# Patient Record
Sex: Female | Born: 1970
Health system: Southern US, Community
[De-identification: ages and names within clinical notes are randomized; demographics above are authoritative.]

## PROBLEM LIST (undated history)

## (undated) DIAGNOSIS — E669 Obesity, unspecified: Secondary | ICD-10-CM

## (undated) DIAGNOSIS — K219 Gastro-esophageal reflux disease without esophagitis: Secondary | ICD-10-CM

## (undated) DIAGNOSIS — E785 Hyperlipidemia, unspecified: Secondary | ICD-10-CM

## (undated) DIAGNOSIS — D509 Iron deficiency anemia, unspecified: Secondary | ICD-10-CM

## (undated) HISTORY — DX: Obesity, unspecified: E66.9

## (undated) HISTORY — DX: Gastro-esophageal reflux disease without esophagitis: K21.9

## (undated) HISTORY — PX: TUBAL LIGATION: SHX77

## (undated) HISTORY — DX: Hyperlipidemia, unspecified: E78.5

## (undated) HISTORY — DX: Iron deficiency anemia, unspecified: D50.9

## (undated) HISTORY — PX: CHOLECYSTECTOMY: SHX55

---

## 1998-06-02 ENCOUNTER — Ambulatory Visit (HOSPITAL_COMMUNITY): Admission: RE | Admit: 1998-06-02 | Discharge: 1998-06-02 | Payer: Self-pay | Admitting: Obstetrics

## 1998-09-08 ENCOUNTER — Ambulatory Visit (HOSPITAL_COMMUNITY): Admission: RE | Admit: 1998-09-08 | Discharge: 1998-09-08 | Payer: Self-pay | Admitting: *Deleted

## 1998-10-10 ENCOUNTER — Inpatient Hospital Stay (HOSPITAL_COMMUNITY): Admission: AD | Admit: 1998-10-10 | Discharge: 1998-10-12 | Payer: Self-pay | Admitting: *Deleted

## 2002-11-30 ENCOUNTER — Ambulatory Visit (HOSPITAL_COMMUNITY): Admission: RE | Admit: 2002-11-30 | Discharge: 2002-11-30 | Payer: Self-pay | Admitting: Nephrology

## 2002-11-30 ENCOUNTER — Encounter: Payer: Self-pay | Admitting: Nephrology

## 2002-12-03 ENCOUNTER — Ambulatory Visit (HOSPITAL_COMMUNITY): Admission: RE | Admit: 2002-12-03 | Discharge: 2002-12-03 | Payer: Self-pay | Admitting: Nephrology

## 2002-12-03 ENCOUNTER — Encounter: Payer: Self-pay | Admitting: Nephrology

## 2002-12-06 ENCOUNTER — Other Ambulatory Visit: Admission: RE | Admit: 2002-12-06 | Discharge: 2002-12-06 | Payer: Self-pay | Admitting: Nephrology

## 2003-04-02 ENCOUNTER — Ambulatory Visit (HOSPITAL_COMMUNITY): Admission: RE | Admit: 2003-04-02 | Discharge: 2003-04-02 | Payer: Self-pay | Admitting: Nephrology

## 2003-04-02 ENCOUNTER — Encounter: Payer: Self-pay | Admitting: Nephrology

## 2003-04-29 ENCOUNTER — Encounter (HOSPITAL_BASED_OUTPATIENT_CLINIC_OR_DEPARTMENT_OTHER): Payer: Self-pay | Admitting: General Surgery

## 2003-05-02 ENCOUNTER — Encounter (HOSPITAL_BASED_OUTPATIENT_CLINIC_OR_DEPARTMENT_OTHER): Payer: Self-pay | Admitting: General Surgery

## 2003-05-02 ENCOUNTER — Encounter (INDEPENDENT_AMBULATORY_CARE_PROVIDER_SITE_OTHER): Payer: Self-pay | Admitting: *Deleted

## 2003-05-02 ENCOUNTER — Ambulatory Visit (HOSPITAL_COMMUNITY): Admission: RE | Admit: 2003-05-02 | Discharge: 2003-05-03 | Payer: Self-pay | Admitting: General Surgery

## 2008-06-07 ENCOUNTER — Ambulatory Visit (HOSPITAL_COMMUNITY): Admission: RE | Admit: 2008-06-07 | Discharge: 2008-06-07 | Payer: Self-pay | Admitting: Gastroenterology

## 2010-12-04 NOTE — Op Note (Signed)
[name], [name] NO.:  [id number]   MEDICAL RECORD NO.:  [id number]                   PATIENT TYPE:  OIB   LOCATION:  5733                                 FACILITY:  [facility]   PHYSICIAN:  Leonie Man, M.D.                DATE OF BIRTH:  11-09-1970   DATE OF PROCEDURE:  05/02/2003  DATE OF DISCHARGE:                                 OPERATIVE REPORT   PREOPERATIVE DIAGNOSES:  Chronic calculus cholecystitis.   POSTOPERATIVE DIAGNOSES:  Chronic calculus cholecystitis.   PROCEDURE:  1. Laparoscopic cholecystectomy.  2. Intraoperative cholangiogram.   SURGEON:  Leonie Man, M.D.   ASSISTANT:  Joanne Gavel, M.D.   ANESTHESIA:  General.   INDICATIONS:  This patient is a 40 year old woman presenting with chronic  recurrent upper abdominal pain.  This is associated with nausea and  vomiting.  She underwent abdominal ultrasound which showed multiple  gallstones.  She had a biliary scan which showed cystic duct obstruction.  She comes to the operating room now after the risks and potential benefits  of surgery had been fully discussed, all questions answered and consent  obtained.   DESCRIPTION OF PROCEDURE:  Following the induction of satisfactory general  anesthesia, the patient was positioned supine.  The abdomen was routinely  prepped and draped to be included in the sterile operative field.  Open  laparoscopy created at the umbilicus with insertion of a Hasson cannula and  insufflation of the peritoneal cavity to 14 mmHg.  The scope was inserted  and visual exploration of the abdomen carried out.   The edges were sharp.  There were multiple adhesions from the surface of the  liver to the diaphragm.  The gallbladder was chronically scarred.  The  anterior gastric wall and duodenum sweep appeared to be normal.  The small  and large intestine viewed and appeared to be abnormal.  Pelvic organs were  not visualized.   Under direct vision,  epigastric and lateral ports were placed.  The  gallbladder was grasped and retracted cephalad, and dissection carried down  to the region of the ampulla and the hepatoduodenal ligament, where the  cystic artery and cystic duct were isolated.  The cystic duct was traced to  its gallbladder/cystic duct junction.  The cystic artery was traced to its  entry into the gallbladder wall.  The cystic artery was doubly clipped and  transected.  The cystic duct was clipped proximally and opened.  The cystic  duct cholangiogram was carried out by inserting a Riddick catheter into the  abdomen, through a 14 gauge angiocatheter; injecting one-half strength  Hypaque into the biliary system while under fluoroscopic control.   The resulting fluoroscopic cholangiogram showed free flow of contrast into  the duodenum, normal biliary calibers and no filling defects.  The  angiocatheter was then removed and the cystic duct was doubly clipped and  then  transected.  The gallbladder was dissected free from the liver bed  using electrocautery and maintaining hemostasis throughout the course of the  dissection.   At the end of the dissection, the liver bed was checked for hemostasis;  noted to be dry.  The gallbladder was placed in an endocatch and retrieved  through the umbilicus.  Sponge, instruments and sharp counts were verified.  Trocars were removed under direct vision.   The abdominal wound was closed in layers as follows, after the  pneumoperitoneum had been completely evacuated.  We closed the wound in two  layers with #0 Vicryl and 4-0 Monocryl.  Epigastric and lateral ports closed  with 4-0 Monocryl sutures and then reinforced with Steri-Strips.  Sterile  dressing was applied.   The anesthetic reversed and patient removed from the operating room to the  recovery room in stable condition.  She tolerated the procedure well.                                               Leonie Man, M.D.     PB/MEDQ  D:  05/02/2003  T:  05/02/2003  Job:  [id number]

## 2012-09-27 ENCOUNTER — Ambulatory Visit (INDEPENDENT_AMBULATORY_CARE_PROVIDER_SITE_OTHER): Payer: 59 | Admitting: Emergency Medicine

## 2012-09-27 VITALS — BP 142/80 | HR 105 | Temp 98.5°F | Resp 18 | Ht 60.5 in | Wt 233.6 lb

## 2012-09-27 DIAGNOSIS — M5430 Sciatica, unspecified side: Secondary | ICD-10-CM | POA: Insufficient documentation

## 2012-09-27 DIAGNOSIS — M5431 Sciatica, right side: Secondary | ICD-10-CM

## 2012-09-27 DIAGNOSIS — E669 Obesity, unspecified: Secondary | ICD-10-CM | POA: Insufficient documentation

## 2012-09-27 MED ORDER — TRAMADOL HCL 50 MG PO TABS: 50.0000 mg | ORAL_TABLET | Freq: Four times a day (QID) | ORAL | Status: DC | PRN

## 2012-09-27 NOTE — Progress Notes (Signed)
[facility] [address] [place] [address] [phone]  Date:  09/27/2012   Name:  [name]   DOB:  08-May-1971   MRN:  [id number]  PCP:  No primary provider on file.    Chief Complaint: Hip Pain   History of Present Illness:  [name] is a 42 y.o. very pleasant female patient who presents with the following:  1 year long history of pain radiating into right leg from buttock.  Says is associated with numbness and weakness of the leg at times.  Describes the pain as a nagging burning pain.  Is intermittent and worse when she sits.  No history of injury.  No history of overuse.  No improvement with over the counter medications or other home remedies. Never had diagnosis of back pain or injury  There is no problem list on file for this patient.   History reviewed. No pertinent past medical history.  Past Surgical History  Procedure Laterality Date  . Cholecystectomy    . Tubal ligation      History  Substance Use Topics  . Smoking status: Never Smoker   . Smokeless tobacco: Not on file  . Alcohol Use: No    Family History  Problem Relation Age of Onset  . Cancer Mother   . Multiple sclerosis Father   . Sarcoidosis Sister     No Known Allergies  Medication list has been reviewed and updated.  No current outpatient prescriptions on file prior to visit.   No current facility-administered medications on file prior to visit.    Review of Systems:  As per HPI, otherwise negative.    Physical Examination: Filed Vitals:   09/27/12 1719  BP: 142/80  Pulse: 105  Temp: 98.5 F (36.9 C)  Resp: 18   Filed Vitals:   09/27/12 1719  Height: 5' 0.5" (1.537 m)  Weight: 233 lb 9.6 oz (105.96 kg)   Body mass index is 44.85 kg/(m^2). Ideal Body Weight: Weight in (lb) to have BMI = 25: 129.9  GEN: morbidly obese, NAD, Non-toxic, A & O x 3 HEENT: Atraumatic, Normocephalic. Neck supple. No masses, No LAD. Ears and Nose: No external  deformity. CV: RRR, No M/G/R. No JVD. No thrill. No extra heart sounds. PULM: CTA B, no wheezes, crackles, rhonchi. No retractions. No resp. distress. No accessory muscle use. ABD: S, NT, ND, +BS. No rebound. No HSM. EXTR: No c/c/e NEURO Normal gait.  PSYCH: Normally interactive. Conversant. Not depressed or anxious appearing.  Calm demeanor.  Back:  Tender right sciatic notch.  Motor and sensory intact  Assessment and Plan: Sciatic neuritis MRI tramadol  Carmelina Dane, MD

## 2012-09-27 NOTE — Patient Instructions (Addendum)
Sciatica Sciatica is pain, weakness, numbness, or tingling along the path of the sciatic nerve. The nerve starts in the lower back and runs down the back of each leg. The nerve controls the muscles in the lower leg and in the back of the knee, while also providing sensation to the back of the thigh, lower leg, and the sole of your foot. Sciatica is a symptom of another medical condition. For instance, nerve damage or certain conditions, such as a herniated disk or bone spur on the spine, pinch or put pressure on the sciatic nerve. This causes the pain, weakness, or other sensations normally associated with sciatica. Generally, sciatica only affects one side of the body. CAUSES   Herniated or slipped disc.  Degenerative disk disease.  A pain disorder involving the narrow muscle in the buttocks (piriformis syndrome).  Pelvic injury or fracture.  Pregnancy.  Tumor (rare). SYMPTOMS  Symptoms can vary from mild to very severe. The symptoms usually travel from the low back to the buttocks and down the back of the leg. Symptoms can include:  Mild tingling or dull aches in the lower back, leg, or hip.  Numbness in the back of the calf or sole of the foot.  Burning sensations in the lower back, leg, or hip.  Sharp pains in the lower back, leg, or hip.  Leg weakness.  Severe back pain inhibiting movement. These symptoms may get worse with coughing, sneezing, laughing, or prolonged sitting or standing. Also, being overweight may worsen symptoms. DIAGNOSIS  Your caregiver will perform a physical exam to look for common symptoms of sciatica. He or she may ask you to do certain movements or activities that would trigger sciatic nerve pain. Other tests may be performed to find the cause of the sciatica. These may include:  Blood tests.  X-rays.  Imaging tests, such as an MRI or CT scan. TREATMENT  Treatment is directed at the cause of the sciatic pain. Sometimes, treatment is not necessary  and the pain and discomfort goes away on its own. If treatment is needed, your caregiver may suggest:  Over-the-counter medicines to relieve pain.  Prescription medicines, such as anti-inflammatory medicine, muscle relaxants, or narcotics.  Applying heat or ice to the painful area.  Steroid injections to lessen pain, irritation, and inflammation around the nerve.  Reducing activity during periods of pain.  Exercising and stretching to strengthen your abdomen and improve flexibility of your spine. Your caregiver may suggest losing weight if the extra weight makes the back pain worse.  Physical therapy.  Surgery to eliminate what is pressing or pinching the nerve, such as a bone spur or part of a herniated disk. HOME CARE INSTRUCTIONS   Only take over-the-counter or prescription medicines for pain or discomfort as directed by your caregiver.  Apply ice to the affected area for 20 minutes, 3 4 times a day for the first 48 72 hours. Then try heat in the same way.  Exercise, stretch, or perform your usual activities if these do not aggravate your pain.  Attend physical therapy sessions as directed by your caregiver.  Keep all follow-up appointments as directed by your caregiver.  Do not wear high heels or shoes that do not provide proper support.  Check your mattress to see if it is too soft. A firm mattress may lessen your pain and discomfort. SEEK IMMEDIATE MEDICAL CARE IF:   You lose control of your bowel or bladder (incontinence).  You have increasing weakness in the lower back,   pelvis, buttocks, or legs.  You have redness or swelling of your back.  You have a burning sensation when you urinate.  You have pain that gets worse when you lie down or awakens you at night.  Your pain is worse than you have experienced in the past.  Your pain is lasting longer than 4 weeks.  You are suddenly losing weight without reason. MAKE SURE YOU:  Understand these  instructions.  Will watch your condition.  Will get help right away if you are not doing well or get worse. Document Released: 06/29/2001 Document Revised: 01/04/2012 Document Reviewed: 11/14/2011 [employer] Patient Information 2013 [employer].  

## 2012-09-28 NOTE — Progress Notes (Signed)
Reviewed and agree.

## 2012-09-29 ENCOUNTER — Ambulatory Visit (HOSPITAL_COMMUNITY): Admission: RE | Admit: 2012-09-29 | Payer: 59 | Source: Ambulatory Visit

## 2012-10-02 ENCOUNTER — Ambulatory Visit (HOSPITAL_COMMUNITY): Payer: 59

## 2013-01-29 ENCOUNTER — Other Ambulatory Visit: Payer: Self-pay

## 2013-01-29 DIAGNOSIS — Z1231 Encounter for screening mammogram for malignant neoplasm of breast: Secondary | ICD-10-CM

## 2013-02-16 ENCOUNTER — Ambulatory Visit: Payer: 59

## 2013-02-19 ENCOUNTER — Ambulatory Visit
Admission: RE | Admit: 2013-02-19 | Discharge: 2013-02-19 | Disposition: A | Discharge status: Home / Self Care | Payer: 59 | Source: Ambulatory Visit

## 2013-02-19 DIAGNOSIS — Z1231 Encounter for screening mammogram for malignant neoplasm of breast: Secondary | ICD-10-CM

## 2015-01-30 ENCOUNTER — Ambulatory Visit (INDEPENDENT_AMBULATORY_CARE_PROVIDER_SITE_OTHER): Payer: 59 | Admitting: Family Medicine

## 2015-01-30 VITALS — BP 120/82 | HR 88 | Temp 98.4°F | Resp 14 | Ht 60.0 in | Wt 244.0 lb

## 2015-01-30 DIAGNOSIS — R062 Wheezing: Secondary | ICD-10-CM

## 2015-01-30 DIAGNOSIS — J209 Acute bronchitis, unspecified: Secondary | ICD-10-CM

## 2015-01-30 MED ORDER — CEFDINIR 300 MG PO CAPS: 300.0000 mg | ORAL_CAPSULE | Freq: Two times a day (BID) | ORAL | Status: DC

## 2015-01-30 MED ORDER — ALBUTEROL SULFATE HFA 108 (90 BASE) MCG/ACT IN AERS: 2.0000 | INHALATION_SPRAY | Freq: Four times a day (QID) | RESPIRATORY_TRACT | Status: DC | PRN

## 2015-01-30 NOTE — Patient Instructions (Signed)
Please let me know if you are not improved in the next couple of days, but you will probably cough for a few weeks Use the omnicef twice a day, and the albuterol as needed for wheezing Continue to use mucinex and other OTC medications as needed

## 2015-01-30 NOTE — Progress Notes (Signed)
[facility] [address], [place] [id number] [phone]  Date:  01/30/2015   Name:  [name]   DOB:  [birth year]   MRN:  [id number]  PCP:  No primary care provider on file.    Chief Complaint: Cough and Wheezing   History of Present Illness:  [name] is a 44 y.o. very pleasant female patient who presents with the following:  She has ntoed a cough for about 3 weeks. She is bringing up some green phlegm.  The cough started out dry and then became productive She has noted some wheezing she thinks She has not noted a fever No nasal sx, no ST, no earache No GI symptoms.  She is s/p BTL She has tried some mucinex DM, and some OTC cough and cold med  Patient Active Problem List   Diagnosis Date Noted  . Sciatic neuritis 09/27/2012  . Morbid obesity 09/27/2012    History reviewed. No pertinent past medical history.  Past Surgical History  Procedure Laterality Date  . Cholecystectomy    . Tubal ligation      History  Substance Use Topics  . Smoking status: Never Smoker   . Smokeless tobacco: Not on file  . Alcohol Use: No    Family History  Problem Relation Age of Onset  . Cancer Mother   . Multiple sclerosis Father   . Sarcoidosis Sister     No Known Allergies  Medication list has been reviewed and updated.  No current outpatient prescriptions on file prior to visit.   No current facility-administered medications on file prior to visit.    Review of Systems:  As per HPI- otherwise negative.   Physical Examination: Filed Vitals:   01/30/15 1542  BP: 120/82  Pulse: 88  Temp: 98.4 F (36.9 C)  Resp: 14   Filed Vitals:   01/30/15 1542  Height: 5' (1.524 m)  Weight: 244 lb (110.678 kg)   Body mass index is 47.65 kg/(m^2). Ideal Body Weight: Weight in (lb) to have BMI = 25: 127.7  GEN: WDWN, NAD, Non-toxic, A & O x 3, morbid obesity but OW looks well HEENT: Atraumatic, Normocephalic. Neck supple. No masses, No  LAD.  Bilateral TM wnl, oropharynx normal.  PEERL,EOMI.   Ears and Nose: No external deformity. CV: RRR, No M/G/R. No JVD. No thrill. No extra heart sounds. PULM: CTA B, no wheezes, crackles, rhonchi in bilateral bases/ congestion. No retractions. No resp. distress. No accessory muscle use. EXTR: No c/c/e NEURO Normal gait.  PSYCH: Normally interactive. Conversant. Not depressed or anxious appearing.  Calm demeanor.    Assessment and Plan: Acute bronchitis, unspecified organism - Plan: cefdinir (OMNICEF) 300 MG capsule  Wheezing - Plan: albuterol (PROVENTIL HFA;VENTOLIN HFA) 108 (90 BASE) MCG/ACT inhaler  Treat as above with omnicef and albuterol as needed Follow-up if not improved soon  Signed Abbe AmsterdamJessica Swayzie Choate, MD

## 2015-07-14 ENCOUNTER — Ambulatory Visit (INDEPENDENT_AMBULATORY_CARE_PROVIDER_SITE_OTHER): Payer: 59 | Admitting: Emergency Medicine

## 2015-07-14 VITALS — BP 138/82 | HR 97 | Temp 97.9°F | Resp 18 | Ht 61.0 in | Wt 241.8 lb

## 2015-07-14 DIAGNOSIS — J209 Acute bronchitis, unspecified: Secondary | ICD-10-CM

## 2015-07-14 MED ORDER — HYDROCOD POLST-CPM POLST ER 10-8 MG/5ML PO SUER: 5.0000 mL | Freq: Two times a day (BID) | ORAL | Status: DC

## 2015-07-14 MED ORDER — AZITHROMYCIN 250 MG PO TABS: ORAL_TABLET | ORAL | Status: DC

## 2015-07-14 NOTE — Patient Instructions (Signed)

## 2015-07-14 NOTE — Progress Notes (Signed)
Subjective:  Patient ID: [name], female    DOB: 07-28-1970  Age: 44 y.o. MRN: [id number]  CC: Cough   HPI [name] presents   With a cough. She's been ill for probably 2 weeks with a cough productive purulent sputum. Earlier she had a fever. She has no nasal congestion postnasal drainage and nasal discharge. She has no symptoms of reflux. She has no wheezing or shortness of breath. Has no fever chills currently. Been unable to control her symptoms with over-the-counter edication  History [name] has no past medical history on file.   She has past surgical history that includes Cholecystectomy and Tubal ligation.   Her  family history includes Cancer in her mother; Multiple sclerosis in her father; Sarcoidosis in her sister.  She   reports that she has never smoked. She does not have any smokeless tobacco history on file. She reports that she does not drink alcohol or use illicit drugs.  Outpatient Prescriptions Prior to Visit  Medication Sig Dispense Refill  . albuterol (PROVENTIL HFA;VENTOLIN HFA) 108 (90 BASE) MCG/ACT inhaler Inhale 2 puffs into the lungs every 6 (six) hours as needed for wheezing or shortness of breath. (Patient not taking: Reported on 07/14/2015) 1 Inhaler 0  . cefdinir (OMNICEF) 300 MG capsule Take 1 capsule (300 mg total) by mouth 2 (two) times daily. (Patient not taking: Reported on 07/14/2015) 20 capsule 0   No facility-administered medications prior to visit.    Social History   Social History  . Marital Status: Single    Spouse Name: N/A  . Number of Children: N/A  . Years of Education: N/A   Social History Main Topics  . Smoking status: Never Smoker   . Smokeless tobacco: None  . Alcohol Use: No  . Drug Use: No  . Sexual Activity: Yes    Birth Control/ Protection: Surgical   Other Topics Concern  . None   Social History Narrative     Review of Systems  Constitutional: Negative for fever, chills and appetite change.  HENT:  Negative for congestion, ear pain, postnasal drip, sinus pressure and sore throat.   Eyes: Negative for pain and redness.  Respiratory: Positive for cough. Negative for shortness of breath and wheezing.   Cardiovascular: Negative for leg swelling.  Gastrointestinal: Negative for nausea, vomiting, abdominal pain, diarrhea, constipation and blood in stool.  Endocrine: Negative for polyuria.  Genitourinary: Negative for dysuria, urgency, frequency and flank pain.  Musculoskeletal: Negative for gait problem.  Skin: Negative for rash.  Neurological: Negative for weakness and headaches.  Psychiatric/Behavioral: Negative for confusion and decreased concentration. The patient is not nervous/anxious.     Objective:  BP 138/82 mmHg  Pulse 97  Temp(Src) 97.9 F (36.6 C) (Oral)  Resp 18  Ht  (1.549 m)  Wt 241 lb 12.8 oz (109.68 kg)  BMI 45.71 kg/m2  SpO2 98%  LMP 07/05/2015  Physical Exam  Constitutional: She is oriented to person, place, and time. She appears well-developed and well-nourished. No distress.  HENT:  Head: Normocephalic and atraumatic.  Right Ear: External ear normal.  Left Ear: External ear normal.  Nose: Nose normal.  Eyes: Conjunctivae and EOM are normal. Pupils are equal, round, and reactive to light. No scleral icterus.  Neck: Normal range of motion. Neck supple. No tracheal deviation present.  Cardiovascular: Normal rate, regular rhythm and normal heart sounds.   Pulmonary/Chest: Effort normal. No respiratory distress. She has no wheezes. She has no rales.  Abdominal: She exhibits no mass. There is no tenderness. There is no rebound and no guarding.  Musculoskeletal: She exhibits no edema.  Lymphadenopathy:    She has no cervical adenopathy.  Neurological: She is alert and oriented to person, place, and time. Coordination normal.  Skin: Skin is warm and dry. No rash noted.  Psychiatric: She has a normal mood and affect. Her behavior is normal.       Assessment & Plan:   [name] was seen today for cough.  Diagnoses and all orders for this visit:  Acute bronchitis, unspecified organism  Other orders -     azithromycin (ZITHROMAX) 250 MG tablet; Take 2 tabs PO x 1 dose, then 1 tab PO QD x 4 days -     chlorpheniramine-HYDROcodone (TUSSIONEX PENNKINETIC ER) 10-8 MG/5ML SUER; Take 5 mLs by mouth 2 (two) times daily.  I am having [name] start on azithromycin and chlorpheniramine-HYDROcodone. I am also having her maintain her cefdinir and albuterol.  Meds ordered this encounter  Medications  . azithromycin (ZITHROMAX) 250 MG tablet    Sig: Take 2 tabs PO x 1 dose, then 1 tab PO QD x 4 days    Dispense:  6 tablet    Refill:  0  . chlorpheniramine-HYDROcodone (TUSSIONEX PENNKINETIC ER) 10-8 MG/5ML SUER    Sig: Take 5 mLs by mouth 2 (two) times daily.    Dispense:  60 mL    Refill:  0    Appropriate red flag conditions were discussed with the patient as well as actions that should be taken.  Patient expressed his understanding.  Follow-up: Return if symptoms worsen or fail to improve.  Carmelina DaneAnderson, Natividad Schlosser S, MD

## 2016-02-14 ENCOUNTER — Ambulatory Visit (INDEPENDENT_AMBULATORY_CARE_PROVIDER_SITE_OTHER): Payer: 59

## 2016-02-14 ENCOUNTER — Ambulatory Visit (INDEPENDENT_AMBULATORY_CARE_PROVIDER_SITE_OTHER): Payer: 59 | Admitting: Family Medicine

## 2016-02-14 VITALS — BP 122/78 | HR 100 | Temp 98.2°F | Resp 17 | Ht 61.0 in | Wt 240.0 lb

## 2016-02-14 DIAGNOSIS — M79671 Pain in right foot: Secondary | ICD-10-CM

## 2016-02-14 DIAGNOSIS — S92354A Nondisplaced fracture of fifth metatarsal bone, right foot, initial encounter for closed fracture: Secondary | ICD-10-CM | POA: Diagnosis not present

## 2016-02-14 DIAGNOSIS — S92901A Unspecified fracture of right foot, initial encounter for closed fracture: Secondary | ICD-10-CM

## 2016-02-14 DIAGNOSIS — S92351A Displaced fracture of fifth metatarsal bone, right foot, initial encounter for closed fracture: Secondary | ICD-10-CM | POA: Diagnosis not present

## 2016-02-14 DIAGNOSIS — M79672 Pain in left foot: Secondary | ICD-10-CM | POA: Diagnosis not present

## 2016-02-14 NOTE — Progress Notes (Addendum)
Patient ID: [name], female    DOB: October 10, 1970, 45 y.o.   MRN: [id number]  PCP: No PCP Per Patient  Chief Complaint  Patient presents with  . Foot Injury    yesterday     Subjective:   HPI Presents for evaluation of right foot pain post inversion injury time 1 day.  Reports walking yesterday and right foot rolled over and she heard a pop. She was able to bear full weight and walk afterwards with some discomfort. After few hours swelling and pain occurred around the ankles and anterior foot.  Reports pain with walking as 8/10. No pain at rest. No medication taken for pain. No ice or heat applied.  . Social History   Social History  . Marital status: Single    Spouse name: N/A  . Number of children: N/A  . Years of education: N/A   Occupational History  . Not on file.   Social History Main Topics  . Smoking status: Never Smoker  . Smokeless tobacco: Not on file  . Alcohol use No  . Drug use: No  . Sexual activity: Yes    Birth control/ protection: Surgical   Other Topics Concern  . Not on file   Social History Narrative  . No narrative on file   . Family History  Problem Relation Age of Onset  . Cancer Mother   . Multiple sclerosis Father   . Sarcoidosis Sister     Review of Systems  Constitutional: Negative.   Eyes: Negative.   Respiratory: Negative.   Musculoskeletal: Positive for joint swelling.       See HPI  Neurological: Negative for numbness.       Patient Active Problem List   Diagnosis Date Noted  . Sciatic neuritis 09/27/2012  . Morbid obesity (HCC) 09/27/2012     Prior to Admission medications   Medication Sig Start Date End Date Taking? Authorizing Provider  chlorpheniramine-HYDROcodone (TUSSIONEX PENNKINETIC ER) 10-8 MG/5ML SUER Take 5 mLs by mouth 2 (two) times daily. Patient not taking: Reported on 02/14/2016 07/14/15   Carmelina Dane, MD     No Known Allergies     Objective:  Physical Exam  Constitutional:  She is oriented to person, place, and time. She appears well-developed and well-nourished.  HENT:  Head: Normocephalic.  Right Ear: External ear normal.  Left Ear: External ear normal.  Eyes: Pupils are equal, round, and reactive to light.  Musculoskeletal: She exhibits edema and tenderness. She exhibits no deformity.  Right foot Decreased active ROM at ankle  +3 non pitting edema bilateral ankle  Neurological: She is alert and oriented to person, place, and time.      .Dg Foot Complete Right  Result Date: 02/14/2016 CLINICAL DATA:  Status post trauma. EXAM: RIGHT FOOT COMPLETE - 3+ VIEW COMPARISON:  None. FINDINGS: There is a fracture through the base of the fifth metatarsal which is not displaced. Soft tissue swelling identified. No other acute abnormalities are identified. IMPRESSION: Nondisplaced fracture through the base of the fifth metatarsal. Electronically Signed   By: Gerome Sam III M.D   On: 02/14/2016 13:27  Assessment & Plan:  .1. Pain in right foot - DG Foot Complete Right; Future - AMB referral to orthopedics  2. Foot fracture, right, closed, initial encounter Patient suffered a nondisplaced fracture at the base of 5th metatarsal.  Plan: Immobilize right foot by placing on  post-op shoe. Limit weight bearing with use of crutches. While at work, must  perform functions while sitting. Avoid any prolonged standing. Return for follow-up in 2 weeks.  Godfrey Pick. Tiburcio Pea, MSN, FNP-C [facility]

## 2016-02-14 NOTE — Patient Instructions (Addendum)
Uses crutches and avoid weight-bearing on right foot until pain free. May take acetaminophen as needed for pain. Avoid ibuprofen as it may delay healing. Return for follow-up in 2 weeks.   IF you received an x-ray today, you will receive an invoice from [facility]. Please contact [facility] at [phone] with questions or concerns regarding your invoice.   IF you received labwork today, you will receive an invoice from [employer]. Please contact [facility] at [phone] with questions or concerns regarding your invoice.   Our billing staff will not be able to assist you with questions regarding bills from these companies.  You will be contacted with the lab results as soon as they are available. The fastest way to get your results is to activate your My Chart account. Instructions are located on the last page of this paperwork. If you have not heard from [place] regarding the results in 2 weeks, please contact this office.

## 2016-02-18 NOTE — Addendum Note (Signed)
Addended by: Bing Neighbors on: 02/18/2016 12:50 PM   Modules accepted: Orders

## 2016-02-28 ENCOUNTER — Ambulatory Visit (INDEPENDENT_AMBULATORY_CARE_PROVIDER_SITE_OTHER): Payer: 59

## 2016-02-28 ENCOUNTER — Ambulatory Visit (INDEPENDENT_AMBULATORY_CARE_PROVIDER_SITE_OTHER): Payer: 59 | Admitting: Physician Assistant

## 2016-02-28 ENCOUNTER — Encounter: Payer: Self-pay | Admitting: Physician Assistant

## 2016-02-28 VITALS — BP 138/78 | HR 78 | Resp 16 | Ht 61.0 in | Wt 241.4 lb

## 2016-02-28 DIAGNOSIS — S92354A Nondisplaced fracture of fifth metatarsal bone, right foot, initial encounter for closed fracture: Secondary | ICD-10-CM | POA: Diagnosis not present

## 2016-02-28 DIAGNOSIS — S92301S Fracture of unspecified metatarsal bone(s), right foot, sequela: Secondary | ICD-10-CM

## 2016-02-28 DIAGNOSIS — S92351A Displaced fracture of fifth metatarsal bone, right foot, initial encounter for closed fracture: Secondary | ICD-10-CM | POA: Diagnosis not present

## 2016-02-28 NOTE — Progress Notes (Signed)
   Natasha Radonadine D Matty  MRN: [id number] DOB: Jul 19, 1971  Subjective:  [name] is a 45 y.o. female seen in office today for a chief complaint of follow up on right 5th metatarsal undisplaced fracture.   Pt was seen in clinic on 02/14/16 with right foot pain. Imaging revealed nondisplaced fracture through the base of the fifth metatarsal. Given post op shoe, encouraged to use crutches and limit weight bearing activity.   Since last visit, pt has wore post op shoe daily. Used crutches for the first couple of days but then stopped. Pt notes she spends about 4 hours daily on her feet.   Today pt has associated tenderness especially when post op shoe is on. Her pain has decreased from an 8/10 on 02/14/16 to a 2/10 today. Denies swelling, numbness, tingling, and decreased ROM. Has been using tylenol prn for pain.   Review of Systems Per HPI  Patient Active Problem List   Diagnosis Date Noted  . Sciatic neuritis 09/27/2012  . Morbid obesity (HCC) 09/27/2012    No current outpatient prescriptions on file prior to visit.   No current facility-administered medications on file prior to visit.     No Known Allergies  Objective:  BP 138/78 (BP Location: Left Arm, Cuff Size: Large)   Pulse 78   Resp 16   Ht 5\' 1"  (1.549 m)   Wt 241 lb 6.4 oz (109.5 kg)   LMP 02/06/2016   SpO2 99%   BMI 45.61 kg/m   Physical Exam  Constitutional: She is oriented to person, place, and time and well-developed, well-nourished, and in no distress.  HENT:  Head: Normocephalic and atraumatic.  Eyes: Conjunctivae are normal.  Neck: Normal range of motion.  Pulmonary/Chest: Effort normal.  Musculoskeletal:       Right foot: There is bony tenderness ( along proximal aspect of 5th metatarsal ). There is normal range of motion and no swelling.       Left foot: Normal.  Neurological: She is alert and oriented to person, place, and time. Gait normal.  Skin: Skin is warm and dry.  Psychiatric: Affect normal.    Vitals reviewed.  Dg Foot Complete Right  Result Date: 02/28/2016 CLINICAL DATA:  Re evaluate 5th metatarsal FX EXAM: RIGHT FOOT COMPLETE - 3+ VIEW COMPARISON:  02/14/2016 FINDINGS: Nondisplaced fracture across the metaphysis of the fifth metatarsal is without substantial change from the prior exam. No radiographic evidence of healing. No new fractures.  No dislocation. Moderate dorsal plantar calcaneal spurs are again noted. IMPRESSION: 1. Fracture across the metaphysis at the base of the right fifth metatarsal, which is a Jones fracture. 2. No change since the prior study. No radiographic evidence of healing. Electronically Signed   By: Amie Portlandavid  Ormond M.D.   On: 02/28/2016 12:26    Assessment and Plan :   1. Fracture of 5th metatarsal, right, sequela - DG Foot Complete Right; Future -Cam walker  -Non weight bearing  -OTC tylenol prn for pain  - Ambulatory referral to Orthopedic Surgery  Benjiman CoreBrittany Frona Yost PA-C  [facility] 02/28/2016 12:41 PM

## 2016-02-28 NOTE — Patient Instructions (Addendum)
Wear cam walker daily. Use crutches daily in order to be non weight bearing.  Orthopedic referral sent. You should hear back within 2 weeks. If you have not heard back in 2 weeks, call our office. Use tylenol prn for pain.      IF you received an x-ray today, you will receive an invoice from [facility]. Please contact [facility] at [phone] with questions or concerns regarding your invoice.   IF you received labwork today, you will receive an invoice from [facility]. Please contact [facility] at [phone] with questions or concerns regarding your invoice.   Our billing staff will not be able to assist you with questions regarding bills from these companies.  You will be contacted with the lab results as soon as they are available. The fastest way to get your results is to activate your My Chart account. Instructions are located on the last page of this paperwork. If you have not heard from us regarding the results in 2 weeks, please contact this office.

## 2016-03-05 DIAGNOSIS — S92354A Nondisplaced fracture of fifth metatarsal bone, right foot, initial encounter for closed fracture: Secondary | ICD-10-CM | POA: Diagnosis not present

## 2016-03-18 DIAGNOSIS — S92354D Nondisplaced fracture of fifth metatarsal bone, right foot, subsequent encounter for fracture with routine healing: Secondary | ICD-10-CM | POA: Diagnosis not present

## 2016-04-12 DIAGNOSIS — M76821 Posterior tibial tendinitis, right leg: Secondary | ICD-10-CM | POA: Diagnosis not present

## 2016-04-12 DIAGNOSIS — S92354D Nondisplaced fracture of fifth metatarsal bone, right foot, subsequent encounter for fracture with routine healing: Secondary | ICD-10-CM | POA: Diagnosis not present

## 2016-05-10 ENCOUNTER — Ambulatory Visit (INDEPENDENT_AMBULATORY_CARE_PROVIDER_SITE_OTHER): Payer: 59 | Admitting: Orthopedic Surgery

## 2016-05-10 ENCOUNTER — Ambulatory Visit (INDEPENDENT_AMBULATORY_CARE_PROVIDER_SITE_OTHER): Payer: 59

## 2016-05-10 ENCOUNTER — Encounter (INDEPENDENT_AMBULATORY_CARE_PROVIDER_SITE_OTHER): Payer: Self-pay | Admitting: Orthopedic Surgery

## 2016-05-10 VITALS — Ht 61.0 in | Wt 235.0 lb

## 2016-05-10 DIAGNOSIS — S92354D Nondisplaced fracture of fifth metatarsal bone, right foot, subsequent encounter for fracture with routine healing: Secondary | ICD-10-CM

## 2016-05-10 NOTE — Progress Notes (Signed)
   Office Visit Note   Patient: [name]           Date of Birth: [birth year]           MRN: [id number] Visit Date: 05/10/2016              Requested by: Dorothyann Pengobyn Sanders, MD [address] [place], [place] [id number] PCP: Gwynneth AlimentSANDERS,ROBYN N, MD   Assessment & Plan: Visit Diagnoses:  1. Closed nondisplaced fracture of fifth metatarsal bone of right foot with routine healing, subsequent encounter     Plan: Follow-up as needed no restrictions advanced to regular shoewear discontinue the fracture boot.  Follow-Up Instructions: Return if symptoms worsen or fail to improve.   Orders:  Orders Placed This Encounter  Procedures  . XR Foot 2 Views Right   No orders of the defined types were placed in this encounter.     Procedures: No procedures performed   Clinical Data: No additional findings.   Subjective: Chief Complaint  Patient presents with  . Right Foot - Fracture    fx base 5th MT DOI 02/14/16    Pt is s/p a fracture base of 5th MT and also posterior tibial tendon insufficiency. Patient ambulates in fx boot. She does not have any complaints of pain and does not take anything for this. Pt has returned to work regular full time duty and does not report any problems. She has no questions or concerns today.    Review of Systems   Objective: Vital Signs: Ht 5\' 1"  (1.549 m)   Wt 235 lb (106.6 kg)   BMI 44.40 kg/m   Physical Exam  Ortho Exam On examination patient is alert oriented no adenopathy well-dressed normal affect normal respiratory effort she does have an antalgic gait the fracture site is nontender to palpation. There is no redness or cellulitis no signs of infection. Specialty Comments:  No specialty comments available.  Imaging: Xr Foot 2 Views Right  Result Date: 05/10/2016 2 view radiographs of the right foot shows a stable nondisplaced fracture of the base of the fifth metatarsal no, getting features no signs of malunion or  nonunion.    PMFS History: Patient Active Problem List   Diagnosis Date Noted  . Sciatic neuritis 09/27/2012  . Morbid obesity (HCC) 09/27/2012   No past medical history on file.  Family History  Problem Relation Age of Onset  . Cancer Mother   . Multiple sclerosis Father   . Sarcoidosis Sister     Past Surgical History:  Procedure Laterality Date  . CHOLECYSTECTOMY    . TUBAL LIGATION     Social History   Occupational History  . Not on file.   Social History Main Topics  . Smoking status: Never Smoker  . Smokeless tobacco: Not on file  . Alcohol use No  . Drug use: No  . Sexual activity: Yes    Birth control/ protection: Surgical

## 2018-01-05 DIAGNOSIS — Z6841 Body Mass Index (BMI) 40.0 and over, adult: Secondary | ICD-10-CM | POA: Diagnosis not present

## 2018-01-05 DIAGNOSIS — R7303 Prediabetes: Secondary | ICD-10-CM | POA: Diagnosis not present

## 2018-01-05 DIAGNOSIS — Z13220 Encounter for screening for lipoid disorders: Secondary | ICD-10-CM | POA: Diagnosis not present

## 2018-01-05 DIAGNOSIS — Z1239 Encounter for other screening for malignant neoplasm of breast: Secondary | ICD-10-CM | POA: Diagnosis not present

## 2018-01-10 DIAGNOSIS — Z1239 Encounter for other screening for malignant neoplasm of breast: Secondary | ICD-10-CM | POA: Diagnosis not present

## 2018-01-10 DIAGNOSIS — Z124 Encounter for screening for malignant neoplasm of cervix: Secondary | ICD-10-CM | POA: Diagnosis not present

## 2018-01-10 DIAGNOSIS — D649 Anemia, unspecified: Secondary | ICD-10-CM | POA: Diagnosis not present

## 2018-01-10 DIAGNOSIS — Z0000 Encounter for general adult medical examination without abnormal findings: Secondary | ICD-10-CM | POA: Diagnosis not present

## 2018-01-10 DIAGNOSIS — E559 Vitamin D deficiency, unspecified: Secondary | ICD-10-CM | POA: Diagnosis not present

## 2018-01-10 DIAGNOSIS — Z6841 Body Mass Index (BMI) 40.0 and over, adult: Secondary | ICD-10-CM | POA: Diagnosis not present

## 2018-01-10 DIAGNOSIS — Z1212 Encounter for screening for malignant neoplasm of rectum: Secondary | ICD-10-CM | POA: Diagnosis not present

## 2018-01-31 ENCOUNTER — Other Ambulatory Visit: Payer: Self-pay | Admitting: Internal Medicine

## 2018-01-31 DIAGNOSIS — Z1231 Encounter for screening mammogram for malignant neoplasm of breast: Secondary | ICD-10-CM

## 2018-02-07 DIAGNOSIS — D649 Anemia, unspecified: Secondary | ICD-10-CM | POA: Diagnosis not present

## 2018-02-07 DIAGNOSIS — D509 Iron deficiency anemia, unspecified: Secondary | ICD-10-CM | POA: Diagnosis not present

## 2018-02-07 LAB — CBC AND DIFFERENTIAL
HEMATOCRIT: 33 — AB (ref 36–46)
Hemoglobin: 9.8 — AB (ref 12.0–16.0)
WBC: 6.6

## 2018-02-07 LAB — IRON,TIBC AND FERRITIN PANEL: Iron: 22

## 2018-02-21 ENCOUNTER — Inpatient Hospital Stay
Admission: RE | Admit: 2018-02-21 | Discharge: 2018-02-21 | Disposition: A | Discharge status: Home / Self Care | Payer: 59 | Source: Ambulatory Visit | Attending: Internal Medicine | Admitting: Internal Medicine

## 2018-02-28 MED FILL — FUSION PLUS CAPSULE: 30 days supply | Qty: 30 | Fill #0

## 2018-04-17 MED FILL — FUSION PLUS CAPSULE: 30 days supply | Qty: 30 | Fill #1

## 2018-04-22 ENCOUNTER — Encounter: Payer: Self-pay | Admitting: Internal Medicine

## 2018-04-22 DIAGNOSIS — D509 Iron deficiency anemia, unspecified: Secondary | ICD-10-CM | POA: Insufficient documentation

## 2018-05-11 ENCOUNTER — Ambulatory Visit: Payer: Self-pay | Admitting: Internal Medicine

## 2018-12-25 DIAGNOSIS — H5203 Hypermetropia, bilateral: Secondary | ICD-10-CM | POA: Diagnosis not present

## 2018-12-25 DIAGNOSIS — H524 Presbyopia: Secondary | ICD-10-CM | POA: Diagnosis not present

## 2019-01-16 ENCOUNTER — Encounter: Payer: 59 | Admitting: Internal Medicine

## 2019-01-18 ENCOUNTER — Encounter: Payer: 59 | Admitting: Internal Medicine

## 2019-02-01 ENCOUNTER — Other Ambulatory Visit: Payer: Self-pay

## 2019-02-01 ENCOUNTER — Ambulatory Visit: Payer: 59 | Admitting: Internal Medicine

## 2019-02-01 ENCOUNTER — Encounter: Payer: Self-pay | Admitting: Internal Medicine

## 2019-02-01 VITALS — BP 114/76 | HR 90 | Temp 98.9°F | Ht 60.2 in | Wt 208.6 lb

## 2019-02-01 DIAGNOSIS — Z23 Encounter for immunization: Secondary | ICD-10-CM

## 2019-02-01 DIAGNOSIS — E782 Mixed hyperlipidemia: Secondary | ICD-10-CM

## 2019-02-01 DIAGNOSIS — Z1239 Encounter for other screening for malignant neoplasm of breast: Secondary | ICD-10-CM | POA: Diagnosis not present

## 2019-02-01 DIAGNOSIS — D508 Other iron deficiency anemias: Secondary | ICD-10-CM | POA: Diagnosis not present

## 2019-02-01 DIAGNOSIS — R21 Rash and other nonspecific skin eruption: Secondary | ICD-10-CM

## 2019-02-01 DIAGNOSIS — Z0001 Encounter for general adult medical examination with abnormal findings: Secondary | ICD-10-CM

## 2019-02-01 DIAGNOSIS — Z0000 Encounter for general adult medical examination without abnormal findings: Secondary | ICD-10-CM | POA: Diagnosis not present

## 2019-02-01 DIAGNOSIS — Z1211 Encounter for screening for malignant neoplasm of colon: Secondary | ICD-10-CM | POA: Diagnosis not present

## 2019-02-01 DIAGNOSIS — B854 Mixed pediculosis and phthiriasis: Secondary | ICD-10-CM

## 2019-02-01 LAB — POCT URINALYSIS DIPSTICK
Bilirubin, UA: NEGATIVE
Glucose, UA: NEGATIVE
Nitrite, UA: POSITIVE
Protein, UA: NEGATIVE
Spec Grav, UA: 1.025 (ref 1.010–1.025)
Urobilinogen, UA: 0.2 E.U./dL
pH, UA: 6.5 (ref 5.0–8.0)

## 2019-02-01 MED ORDER — TETANUS-DIPHTH-ACELL PERTUSSIS 5-2-15.5 LF-MCG/0.5 IM SUSP: 0.5000 mL | Freq: Once | INTRAMUSCULAR | 0 refills | Status: AC

## 2019-02-01 NOTE — Progress Notes (Addendum)
Subjective:     Patient ID: [name] , female    DOB: 03-14-1971 , 48 y.o.   MRN: [id number]   CC "here for a physical"  HPI Pt is here for physical. Last pap was last year and was normal. Has been working on loosing wt.  PMHx- irone deficiency anemia    Obesity   Family History  Problem Relation Age of Onset  . Colon cancer Mother   . Multiple sclerosis Father   . Diabetes Father   . Hypertension Father   . Sarcoidosis Sister   . Diabetes Sister   . Hypertension Sister   . Healthy Son      Current Outpatient Medications:  .  omeprazole (PRILOSEC) 20 MG capsule, Take 20 mg by mouth daily., Disp: , Rfl:    No Known Allergies   Review of Systems  Has been working on wt loss initially doing intermittent fasting, then she has been stressed at work and her appetite is down. Gets rashes under breasts and abdominal folds. Negative for CP. SOB, abdominal pain, constipation, diarrhea, GERD, urinary difficulty, muscle or joint pains, dizziness or numbness.  Today's Vitals   02/01/19 1432  BP: 114/76  Pulse: 90  Temp: 98.9 F (37.2 C)  TempSrc: Oral  Weight: 208 lb 9.6 oz (94.6 kg)  Height: 5' 0.2" (1.529 m)   Body mass index is 40.47 kg/m.   Objective:  Physical Exam  Her last wt was 248.    BP 114/76 (BP Location: Left Arm, Patient Position: Sitting, Cuff Size: Normal)   Pulse 90   Temp 98.9 F (37.2 C) (Oral)   Ht 5' 0.2" (1.529 m)   Wt 208 lb 9.6 oz (94.6 kg)   LMP 01/17/2019   BMI 40.47 kg/m   General Appearance:    Alert, cooperative, no distress, appears stated age  Head:    Normocephalic, without obvious abnormality, atraumatic  Eyes:    PERRL, conjunctiva/corneas clear, EOM's intact.  Ears:    Normal TM's and external ear canals, both ears  Nose:   Nares normal, septum midline, mucosa normal.  Throat:   Lips, mucosa, and tongue normal; teeth and gums normal  Neck:   Supple, symmetrical, trachea midline, no adenopathy;    thyroid:  no  enlargement/tenderness/nodules; no carotid   bruit or JVD  Back:     Symmetric, no curvature, ROM normal, no CVA tenderness  Lungs:     Clear to auscultation bilaterally, respirations unlabored  Chest Wall:    No tenderness or deformity   Heart:    Regular rate and rhythm, S1 and S2 normal, no murmur, rub   or gallop  Breast Exam:    No tenderness,or nipple abnormality. Has pea size lumo about 1oclock which is stable.   Abdomen:     Soft, non-tender, bowel sounds active all four quadrants,    no masses, no organomegaly  Genitalia:    Not examined.   Rectal:    Normal tone, normal prostate, no masses or tenderness;   guaiac negative stool  Extremities:   Extremities normal, atraumatic, no cyanosis or edema  Pulses:   2+ and symmetric all extremities  Skin:   Dark pigment on abdomian folds and below breast where she had rashes. Has normal texture, turgor normal,  or lesions  Lymph nodes:   Cervical, supraclavicular, and axillary nodes normal  Neurologic:   CNII-XII intact, normal strength, sensation and reflexes    Throughout. Normal Romberg, tandem gait, tip toe and  heel gait.    Assessment And Plan:    1. Encounter for immunization- routine - Tdap vaccine greater than or equal to 7yo IM- sent to pharmacy.   2. Screening for breast cancer- screen - MM Digital Screening; Future  3. Mixed hyperlipidemia- chronic, diet controlled - Lipid Profile  4. Other iron deficiency anemia- chronic    Fe, CBC 5. Morbid obesity ([place])- has lost 40 lbs, and will continue with wt loss. Fu 6 months.   6. Encounter for general adult medical examination with abnormal findings- routine. FU 1 y - CMP14 + Anion Gap - CBC no Diff - TSH - T3, free - T4, Free - Lipid Profile - Iron 7- Rash- Hx of chronic skin rashes under breast and abdominal folds. Once she is done loosing wt I discussed with her seeing plastic surgeon to have them removed. No need for treatment right now.   Denson Niccoli  RODRIGUEZ-SOUTHWORTH, PA-C    THE PATIENT IS ENCOURAGED TO PRACTICE SOCIAL DISTANCING DUE TO THE COVID-19 PANDEMIC.

## 2019-02-02 LAB — LIPID PANEL
Chol/HDL Ratio: 2.1 ratio (ref 0.0–4.4)
Cholesterol, Total: 99 mg/dL — ABNORMAL LOW (ref 100–199)
HDL: 48 mg/dL (ref >39)
LDL Calculated: 26 mg/dL (ref 0–99)
Triglycerides: 124 mg/dL (ref 0–149)
VLDL Cholesterol Cal: 25 mg/dL (ref 5–40)

## 2019-02-02 LAB — TSH: TSH: 0.922 u[IU]/mL (ref 0.450–4.500)

## 2019-02-02 LAB — CBC
Hematocrit: 35.7 % (ref 34.0–46.6)
Hemoglobin: 11.7 g/dL (ref 11.1–15.9)
MCH: 31.4 pg (ref 26.6–33.0)
MCHC: 32.8 g/dL (ref 31.5–35.7)
MCV: 96 fL (ref 79–97)
Platelets: 342 10*3/uL (ref 150–450)
RBC: 3.73 x10E6/uL — ABNORMAL LOW (ref 3.77–5.28)
RDW: 13.6 % (ref 11.7–15.4)
WBC: 7 10*3/uL (ref 3.4–10.8)

## 2019-02-02 LAB — CMP14 + ANION GAP
ALT: 12 IU/L (ref 0–32)
AST: 14 IU/L (ref 0–40)
Albumin/Globulin Ratio: 0.9 — ABNORMAL LOW (ref 1.2–2.2)
Albumin: 3.4 g/dL — ABNORMAL LOW (ref 3.8–4.8)
Alkaline Phosphatase: 41 IU/L (ref 39–117)
Anion Gap: 15 mmol/L (ref 10.0–18.0)
BUN/Creatinine Ratio: 14 (ref 9–23)
BUN: 8 mg/dL (ref 6–24)
Bilirubin Total: 0.3 mg/dL (ref 0.0–1.2)
CO2: 22 mmol/L (ref 20–29)
Calcium: 9.2 mg/dL (ref 8.7–10.2)
Chloride: 103 mmol/L (ref 96–106)
Creatinine, Ser: 0.59 mg/dL (ref 0.57–1.00)
GFR calc Af Amer: 127 mL/min/{1.73_m2} (ref >59)
GFR calc non Af Amer: 110 mL/min/{1.73_m2} (ref >59)
Globulin, Total: 4 g/dL (ref 1.5–4.5)
Glucose: 103 mg/dL — ABNORMAL HIGH (ref 65–99)
Potassium: 3.7 mmol/L (ref 3.5–5.2)
Sodium: 140 mmol/L (ref 134–144)
Total Protein: 7.4 g/dL (ref 6.0–8.5)

## 2019-02-02 LAB — T3, FREE: T3, Free: 2.3 pg/mL (ref 2.0–4.4)

## 2019-02-02 LAB — T4, FREE: Free T4: 1.14 ng/dL (ref 0.82–1.77)

## 2019-02-02 LAB — IRON: Iron: 49 ug/dL (ref 27–159)

## 2019-02-05 ENCOUNTER — Other Ambulatory Visit: Payer: Self-pay

## 2019-02-05 DIAGNOSIS — Z0001 Encounter for general adult medical examination with abnormal findings: Secondary | ICD-10-CM

## 2019-02-05 NOTE — Addendum Note (Signed)
Addended by: Steward Ros on: 02/05/2019 08:39 AM   Modules accepted: Orders

## 2019-02-06 LAB — HIV ANTIBODY (ROUTINE TESTING W REFLEX): HIV Screen 4th Generation wRfx: NONREACTIVE

## 2019-03-21 ENCOUNTER — Ambulatory Visit
Admission: RE | Admit: 2019-03-21 | Discharge: 2019-03-21 | Disposition: A | Discharge status: Home / Self Care | Payer: 59 | Source: Ambulatory Visit | Attending: Internal Medicine | Admitting: Internal Medicine

## 2019-03-21 ENCOUNTER — Other Ambulatory Visit: Payer: Self-pay

## 2019-03-21 DIAGNOSIS — Z1231 Encounter for screening mammogram for malignant neoplasm of breast: Secondary | ICD-10-CM | POA: Diagnosis not present

## 2019-03-21 DIAGNOSIS — Z1239 Encounter for other screening for malignant neoplasm of breast: Secondary | ICD-10-CM

## 2019-05-22 ENCOUNTER — Ambulatory Visit: Payer: 59 | Admitting: Nurse Practitioner

## 2019-05-22 ENCOUNTER — Other Ambulatory Visit (HOSPITAL_COMMUNITY)
Admission: RE | Admit: 2019-05-22 | Discharge: 2019-05-22 | Disposition: A | Discharge status: Home / Self Care | Payer: 59 | Source: Ambulatory Visit | Attending: Nurse Practitioner | Admitting: Nurse Practitioner

## 2019-05-22 ENCOUNTER — Encounter: Payer: Self-pay | Admitting: Nurse Practitioner

## 2019-05-22 ENCOUNTER — Other Ambulatory Visit: Payer: Self-pay

## 2019-05-22 VITALS — BP 114/70 | HR 97 | Temp 98.9°F | Ht 60.8 in | Wt 208.6 lb

## 2019-05-22 DIAGNOSIS — R300 Dysuria: Secondary | ICD-10-CM

## 2019-05-22 DIAGNOSIS — N898 Other specified noninflammatory disorders of vagina: Secondary | ICD-10-CM | POA: Diagnosis not present

## 2019-05-22 LAB — POCT URINALYSIS DIPSTICK
Bilirubin, UA: NEGATIVE
Glucose, UA: NEGATIVE
Ketones, UA: NEGATIVE
Nitrite, UA: NEGATIVE
Protein, UA: NEGATIVE
Spec Grav, UA: 1.01 (ref 1.010–1.025)
Urobilinogen, UA: 0.2 E.U./dL
pH, UA: 6 (ref 5.0–8.0)

## 2019-05-22 MED ORDER — CIPROFLOXACIN HCL 500 MG PO TABS: 500.0000 mg | ORAL_TABLET | Freq: Two times a day (BID) | ORAL | 0 refills | Status: AC

## 2019-05-22 MED FILL — CIPROFLOXACIN HCL 500 MG TA: 500 | 10 days supply | Qty: 20 | Fill #0

## 2019-05-22 NOTE — Progress Notes (Signed)
Subjective:     Patient ID: [name] , female    DOB: May 13, 1971 , 48 y.o.   MRN: [id number]   Chief Complaint  Patient presents with  . Dysuria    patient stated she has some burning while urinating, vaginal irritation,discharge. Her symtpoms started on last thursday or friday.    HPI  Dysuria  This is a new problem. The current episode started in the past 7 days. The problem occurs intermittently. The problem has been gradually worsening. The quality of the pain is described as burning. There has been no fever. She is sexually active. There is no history of pyelonephritis. Associated symptoms include a discharge, flank pain and frequency. Pertinent negatives include no chills, hematuria, nausea, possible pregnancy or urgency. She has tried increased fluids for the symptoms. The treatment provided no relief. There is no history of recurrent UTIs. history of urinary tract infection in the past     No past medical history on file.   Family History  Problem Relation Age of Onset  . Colon cancer Mother   . Multiple sclerosis Father   . Diabetes Father   . Hypertension Father   . Sarcoidosis Sister   . Diabetes Sister   . Hypertension Sister   . Healthy Son   . Breast cancer Neg Hx      Current Outpatient Medications:  .  omeprazole (PRILOSEC) 20 MG capsule, Take 20 mg by mouth daily., Disp: , Rfl:    No Known Allergies   Review of Systems  Constitutional: Negative for chills.  Gastrointestinal: Negative for nausea.  Genitourinary: Positive for dysuria, flank pain and frequency. Negative for hematuria and urgency.     Today's Vitals   05/22/19 1500  BP: 114/70  Pulse: 97  Temp: 98.9 F (37.2 C)  TempSrc: Oral  Weight: 208 lb 9.6 oz (94.6 kg)  Height: 5' 0.8" (1.544 m)  PainSc: 0-No pain   Body mass index is 39.67 kg/m.   Objective:  Physical Exam Constitutional:      Appearance: Normal appearance.  Cardiovascular:     Rate and Rhythm: Normal rate and  regular rhythm.     Pulses: Normal pulses.     Heart sounds: No murmur.  Pulmonary:     Effort: Pulmonary effort is normal.     Breath sounds: Normal breath sounds.  Skin:    Capillary Refill: Capillary refill takes less than 2 seconds.  Neurological:     General: No focal deficit present.     Mental Status: She is alert and oriented to person, place, and time.  Psychiatric:        Mood and Affect: Mood normal.        Behavior: Behavior normal.        Thought Content: Thought content normal.        Judgment: Judgment normal.         Assessment And Plan:     1. Dysuria  Since she is having symptoms since last week and large leukocytes will treat with cipro - POCT Urinalysis Dipstick (81002) - Culture, Urine - Urine cytology ancillary only - ciprofloxacin (CIPRO) 500 MG tablet; Take 1 tablet (500 mg total) by mouth 2 (two) times daily for 10 days.  Dispense: 20 tablet; Refill: 0  2. Vaginal discharge  Will check for STD's   Pending labs will add another medication - Urine cytology ancillary only - ciprofloxacin (CIPRO) 500 MG tablet; Take 1 tablet (500 mg total) by mouth 2 (  two) times daily for 10 days.  Dispense: 20 tablet; Refill: 0      Arnette Felts, FNP    THE PATIENT IS ENCOURAGED TO PRACTICE SOCIAL DISTANCING DUE TO THE COVID-19 PANDEMIC.

## 2019-05-24 ENCOUNTER — Ambulatory Visit: Payer: 59 | Admitting: Internal Medicine

## 2019-05-24 ENCOUNTER — Ambulatory Visit: Payer: 59 | Admitting: Nurse Practitioner

## 2019-05-28 ENCOUNTER — Other Ambulatory Visit: Payer: Self-pay | Admitting: Nurse Practitioner

## 2019-05-28 ENCOUNTER — Telehealth: Payer: Self-pay | Admitting: Nurse Practitioner

## 2019-05-28 DIAGNOSIS — A599 Trichomoniasis, unspecified: Secondary | ICD-10-CM

## 2019-05-28 DIAGNOSIS — B9689 Other specified bacterial agents as the cause of diseases classified elsewhere: Secondary | ICD-10-CM

## 2019-05-28 LAB — URINE CYTOLOGY ANCILLARY ONLY
Bacterial Vaginitis-Urine: POSITIVE — AB
Bacterial Vaginitis-Urine: POSITIVE — AB
Bacterial Vaginitis-Urine: POSITIVE — AB
Bacterial Vaginitis-Urine: POSITIVE — AB
Candida Urine: NEGATIVE
Chlamydia: NEGATIVE
Comment: NEGATIVE
Comment: NEGATIVE
Comment: NORMAL
Neisseria Gonorrhea: NEGATIVE
Trichomonas: POSITIVE — AB

## 2019-05-28 MED ORDER — METRONIDAZOLE 500 MG PO TABS: 500.0000 mg | ORAL_TABLET | Freq: Three times a day (TID) | ORAL | 0 refills | Status: AC

## 2019-05-28 MED ORDER — AZITHROMYCIN 250 MG PO TABS: ORAL_TABLET | ORAL | 0 refills | Status: AC

## 2019-05-28 NOTE — Telephone Encounter (Signed)
Called patient and given results of STD panel, she is advised to not drink alcohol while taking metronidazole and to have her partner treated for trichomoniasis.  Verbalized understanding.

## 2019-05-29 MED FILL — metroNIDAZOLE 500 MG TABS: 500 | 10 days supply | Qty: 30 | Fill #0

## 2019-05-29 MED FILL — AZITHROMYCIN 250 MG TABLET: 250 | 1 days supply | Qty: 4 | Fill #0

## 2019-08-09 ENCOUNTER — Ambulatory Visit: Payer: 59 | Admitting: Internal Medicine

## 2019-08-23 ENCOUNTER — Ambulatory Visit (INDEPENDENT_AMBULATORY_CARE_PROVIDER_SITE_OTHER): Payer: No Typology Code available for payment source | Admitting: Internal Medicine

## 2019-08-23 ENCOUNTER — Encounter: Payer: Self-pay | Admitting: Internal Medicine

## 2019-08-23 ENCOUNTER — Other Ambulatory Visit: Payer: Self-pay

## 2019-08-23 VITALS — BP 118/72 | HR 88 | Temp 98.0°F | Ht 60.8 in | Wt 209.2 lb

## 2019-08-23 DIAGNOSIS — E782 Mixed hyperlipidemia: Secondary | ICD-10-CM | POA: Insufficient documentation

## 2019-08-23 DIAGNOSIS — Z6839 Body mass index (BMI) 39.0-39.9, adult: Secondary | ICD-10-CM | POA: Diagnosis not present

## 2019-08-23 DIAGNOSIS — Z79899 Other long term (current) drug therapy: Secondary | ICD-10-CM | POA: Diagnosis not present

## 2019-08-23 MED ORDER — PHENTERMINE HCL 37.5 MG PO CAPS: ORAL_CAPSULE | ORAL | 0 refills | Status: DC

## 2019-08-23 MED FILL — PHENTERMINE 37.5 MG TABLET: 37.5 | 30 days supply | Qty: 30 | Fill #0

## 2019-08-23 NOTE — Progress Notes (Signed)
* This visit occurred during the SARS-CoV-2 public health emergency.  Safety protocols were in place, including screening questions prior to the visit, additional usage of staff PPE, and extensive cleaning of exam room while observing appropriate contact time as indicated for disinfecting solutions.  Subjective:     Patient ID: [name] , female    DOB: 21-Oct-1970 , 49 y.o.   MRN: [id number]   Chief Complaint  Patient presents with  . Weight Check    Lipid    HPI  1-Here for wt check and to get lipid panel.  She has continued doing intermittent fasting but is stuck.  Is interested in Phentermine. Had this in the past for one month and helped her, but never came back for refills. Denies having dry mouth, insomnia or constipation while on this.  She does exercise qd for 30 min 2- has been on low fat diet and is ready to check her lipids.   No past medical history on file.   Family History  Problem Relation Age of Onset  . Colon cancer Mother   . Multiple sclerosis Father   . Diabetes Father   . Hypertension Father   . Sarcoidosis Sister   . Diabetes Sister   . Hypertension Sister   . Healthy Son   . Breast cancer Neg Hx      Current Outpatient Medications:  .  omeprazole (PRILOSEC) 20 MG capsule, Take 20 mg by mouth daily., Disp: , Rfl:  .  phentermine 37.5 MG capsule, 1/2 to 1 30 minutes before breakfast, Disp: 30 capsule, Rfl: 0   No Known Allergies   Review of Systems  Denies CP, SOB, palpitations. Her clothes feel loose though she is up 1 lb on her wt.  The rest of 10 point ROS is neg.  Today's Vitals   08/23/19 1409  BP: 118/72  Pulse: 88  Temp: 98 F (36.7 C)  TempSrc: Oral  SpO2: 99%  Weight: 209 lb 3.2 oz (94.9 kg)  Height: 5' 0.8" (1.544 m)   Body mass index is 39.79 kg/m.   Objective:  Physical Exam  Wt is up 1 lb    Constitutional: She is oriented to person, place, and time. She appears well-developed and well-nourished. No distress.   HENT:  Head: Normocephalic and atraumatic.  Right Ear: External ear normal.  Left Ear: External ear normal.  Nose: Nose normal.  Eyes: Conjunctivae are normal. Right eye exhibits no discharge. Left eye exhibits no discharge. No scleral icterus.  Neck: Neck supple. No thyromegaly present.  Cardiovascular: Normal rate and regular rhythm.  No murmur heard. Pulmonary/Chest: Effort normal and breath sounds normal. No respiratory distress.  Musculoskeletal: Normal range of motion. She exhibits no edema.  Lymphadenopathy:    She has no cervical adenopathy.  Neurological: She is alert and oriented to person, place, and time.  Skin: Skin is warm and dry. Capillary refill takes less than 2 seconds. No rash noted. She is not diaphoretic.  Psychiatric: She has a normal mood and affect. Her behavior is normal. Judgment and thought content normal.  Nursing note reviewed. EKG- normal Assessment And Plan:    1. Mixed hyperlipidemia- chronic. We will inform her when the results are back. - Lipid panel  2. Morbid obesity (HCC)- chronic. I started her on phentermine 37.5 mg 1/2 -1 30 min before breakfasts. FU monthly for wt check.     Arie Powell RODRIGUEZ-SOUTHWORTH, PA-C    THE PATIENT IS ENCOURAGED TO PRACTICE SOCIAL DISTANCING DUE  TO THE COVID-19 PANDEMIC.

## 2019-08-24 LAB — LIPID PANEL
Chol/HDL Ratio: 2.4 ratio (ref 0.0–4.4)
Cholesterol, Total: 111 mg/dL (ref 100–199)
HDL: 47 mg/dL (ref >39)
LDL Chol Calc (NIH): 45 mg/dL (ref 0–99)
Triglycerides: 100 mg/dL (ref 0–149)
VLDL Cholesterol Cal: 19 mg/dL (ref 5–40)

## 2019-09-20 ENCOUNTER — Ambulatory Visit: Payer: No Typology Code available for payment source | Admitting: Internal Medicine

## 2019-10-04 ENCOUNTER — Encounter: Payer: Self-pay | Admitting: Internal Medicine

## 2019-10-04 ENCOUNTER — Ambulatory Visit (INDEPENDENT_AMBULATORY_CARE_PROVIDER_SITE_OTHER): Payer: No Typology Code available for payment source | Admitting: Internal Medicine

## 2019-10-04 ENCOUNTER — Ambulatory Visit: Payer: No Typology Code available for payment source | Admitting: Internal Medicine

## 2019-10-04 ENCOUNTER — Other Ambulatory Visit: Payer: Self-pay

## 2019-10-04 VITALS — BP 124/80 | HR 80 | Temp 98.3°F | Ht 60.8 in | Wt 205.0 lb

## 2019-10-04 DIAGNOSIS — R7309 Other abnormal glucose: Secondary | ICD-10-CM

## 2019-10-04 DIAGNOSIS — Z6838 Body mass index (BMI) 38.0-38.9, adult: Secondary | ICD-10-CM

## 2019-10-04 DIAGNOSIS — R632 Polyphagia: Secondary | ICD-10-CM | POA: Diagnosis not present

## 2019-10-04 NOTE — Progress Notes (Signed)
This visit occurred during the SARS-CoV-2 public health emergency.  Safety protocols were in place, including screening questions prior to the visit, additional usage of staff PPE, and extensive cleaning of exam room while observing appropriate contact time as indicated for disinfecting solutions.  Subjective:     Patient ID: [name] , female    DOB: Jul 04, 1971 , 49 y.o.   MRN: [id number]   Chief Complaint  Patient presents with  . Weight Check    HPI Has not been able to tolerate  The phentermine since it made her more hungry. She has been on a low carb diet. Has been exercising 30 minutes a day   Past Medical History:  Diagnosis Date  . GERD (gastroesophageal reflux disease)   . Hyperlipidemia   . Iron deficiency anemia   . Obesity      Family History  Problem Relation Age of Onset  . Colon cancer Mother   . Multiple sclerosis Father   . Diabetes Father   . Hypertension Father   . Sarcoidosis Sister   . Diabetes Sister   . Hypertension Sister   . Healthy Son   . Breast cancer Neg Hx      Current Outpatient Medications:  .  omeprazole (PRILOSEC) 20 MG capsule, Take 20 mg by mouth daily., Disp: , Rfl:  .  phentermine 37.5 MG capsule, 1/2 to 1 30 minutes before breakfast, Disp: 30 capsule, Rfl: 0   No Known Allergies   Review of Systems  Review of Systems  Constitutional: Negative for diaphoresis and unexpected weight change.  HENT: Negative for tinnitus.   Eyes: Negative for visual disturbance.  Respiratory: Negative for chest tightness and shortness of breath.   Cardiovascular: Negative for chest pain, palpitations and leg swelling.  Gastrointestinal: Negative for constipation, diarrhea and nausea.  Endocrine: Negative for polydipsia, polyphagia and polyuria.  Genitourinary: Negative for dysuria and frequency.  Skin: Negative for rash and wound.  Neurological: Negative for dizziness, speech difficulty, weakness, numbness and headaches.  Has had  polydipsia from the phentermine Today's Vitals   10/04/19 1446  BP: 124/80  Pulse: 80  Temp: 98.3 F (36.8 C)  TempSrc: Oral  Weight: 205 lb (93 kg)  Height: 5' 0.8" (1.544 m)  PainSc: 0-No pain   Body mass index is 38.99 kg/m.   Objective:  Physical Exam   Constitutional: She is oriented to person, place, and time. She appears well-developed and well-nourished. No distress.  HENT:  Head: Normocephalic and atraumatic.  Right Ear: External ear normal.  Left Ear: External ear normal.  Nose: Nose normal.  Eyes: Conjunctivae are normal. Right eye exhibits no discharge. Left eye exhibits no discharge. No scleral icterus.  Neck: Neck supple. No thyromegaly present.  No carotid bruits bilaterally  Cardiovascular: Normal rate and regular rhythm.  No murmur heard. Pulmonary/Chest: Effort normal and breath sounds normal. No respiratory distress.  Musculoskeletal: Normal range of motion. She exhibits no edema.  Lymphadenopathy:    She has no cervical adenopathy.  Neurological: She is alert and oriented to person, place, and time.  Skin: Skin is warm and dry. Capillary refill takes less than 2 seconds. No rash noted. She is not diaphoretic.  Psychiatric: She has a normal mood and affect. Her behavior is normal. Judgment and thought content normal.  Nursing note reviewed.   Assessment And Plan:  1. Abnormal glucose- new. If insulin is elevated I will have her try Metformin and if she fails this, may try Ozempic.  -  Insulin, random(561) - CMP14 + Anion Gap - Hemoglobin A1c  2. Polyphagia- secondary to intolerance to Phentermine.  - Hemoglobin A1c  3. Class 3 severe obesity due to excess calories with body mass index (BMI) of 50.0 to 59.9 in adult, unspecified whether serious comorbidity present ([place])- slowly improvokng. FU in 6 weeks for wt check. Enocuraged to continue with low carb diet.  - TSH - T4, Free - T3, free      Vernida Mcnicholas RODRIGUEZ-SOUTHWORTH, PA-C    THE PATIENT IS  ENCOURAGED TO PRACTICE SOCIAL DISTANCING DUE TO THE COVID-19 PANDEMIC.

## 2019-10-05 LAB — CMP14 + ANION GAP
ALT: 10 IU/L (ref 0–32)
AST: 15 IU/L (ref 0–40)
Albumin/Globulin Ratio: 0.9 — ABNORMAL LOW (ref 1.2–2.2)
Albumin: 3.6 g/dL — ABNORMAL LOW (ref 3.8–4.8)
Alkaline Phosphatase: 49 IU/L (ref 39–117)
Anion Gap: 11 mmol/L (ref 10.0–18.0)
BUN/Creatinine Ratio: 15 (ref 9–23)
BUN: 10 mg/dL (ref 6–24)
Bilirubin Total: <0.2 mg/dL (ref 0.0–1.2)
CO2: 24 mmol/L (ref 20–29)
Calcium: 9.4 mg/dL (ref 8.7–10.2)
Chloride: 102 mmol/L (ref 96–106)
Creatinine, Ser: 0.66 mg/dL (ref 0.57–1.00)
GFR calc Af Amer: 122 mL/min/{1.73_m2} (ref >59)
GFR calc non Af Amer: 106 mL/min/{1.73_m2} (ref >59)
Globulin, Total: 4.2 g/dL (ref 1.5–4.5)
Glucose: 91 mg/dL (ref 65–99)
Potassium: 4.4 mmol/L (ref 3.5–5.2)
Sodium: 137 mmol/L (ref 134–144)
Total Protein: 7.8 g/dL (ref 6.0–8.5)

## 2019-10-05 LAB — T4, FREE: Free T4: 1.19 ng/dL (ref 0.82–1.77)

## 2019-10-05 LAB — INSULIN, RANDOM: INSULIN: 16.4 u[IU]/mL (ref 2.6–24.9)

## 2019-10-05 LAB — T3, FREE: T3, Free: 2.8 pg/mL (ref 2.0–4.4)

## 2019-10-05 LAB — TSH: TSH: 1.86 u[IU]/mL (ref 0.450–4.500)

## 2019-10-05 LAB — HEMOGLOBIN A1C
Est. average glucose Bld gHb Est-mCnc: 111 mg/dL
Hgb A1c MFr Bld: 5.5 % (ref 4.8–5.6)

## 2019-11-15 ENCOUNTER — Ambulatory Visit: Payer: No Typology Code available for payment source | Admitting: Internal Medicine

## 2020-02-14 ENCOUNTER — Encounter: Payer: 59 | Admitting: Internal Medicine

## 2020-02-21 ENCOUNTER — Encounter: Payer: No Typology Code available for payment source | Admitting: Internal Medicine

## 2020-02-28 ENCOUNTER — Encounter: Payer: No Typology Code available for payment source | Admitting: Nurse Practitioner

## 2020-04-14 ENCOUNTER — Other Ambulatory Visit: Payer: Self-pay

## 2020-04-14 ENCOUNTER — Ambulatory Visit (INDEPENDENT_AMBULATORY_CARE_PROVIDER_SITE_OTHER): Payer: No Typology Code available for payment source | Admitting: Nurse Practitioner

## 2020-04-14 ENCOUNTER — Encounter: Payer: Self-pay | Admitting: Nurse Practitioner

## 2020-04-14 VITALS — BP 122/76 | HR 68 | Temp 98.2°F | Ht 60.8 in | Wt 202.6 lb

## 2020-04-14 DIAGNOSIS — Z1159 Encounter for screening for other viral diseases: Secondary | ICD-10-CM

## 2020-04-14 DIAGNOSIS — E782 Mixed hyperlipidemia: Secondary | ICD-10-CM

## 2020-04-14 DIAGNOSIS — N63 Unspecified lump in unspecified breast: Secondary | ICD-10-CM

## 2020-04-14 DIAGNOSIS — D509 Iron deficiency anemia, unspecified: Secondary | ICD-10-CM | POA: Diagnosis not present

## 2020-04-14 DIAGNOSIS — Z6838 Body mass index (BMI) 38.0-38.9, adult: Secondary | ICD-10-CM

## 2020-04-14 DIAGNOSIS — Z0000 Encounter for general adult medical examination without abnormal findings: Secondary | ICD-10-CM

## 2020-04-14 NOTE — Progress Notes (Signed)
This visit occurred during the SARS-CoV-2 public health emergency.  Safety protocols were in place, including screening questions prior to the visit, additional usage of staff PPE, and extensive cleaning of exam room while observing appropriate contact time as indicated for disinfecting solutions.  Subjective:     Patient ID: [name] , female    DOB: 11-08-1970 , 49 y.o.   MRN: [id number]   Chief Complaint  Patient presents with  . Annual Exam    HPI  Patient here for HM.  She had taken phentermine for a short period of time which made her more hungry.    Wt Readings from Last 3 Encounters: 04/14/20 : 202 lb 9.6 oz (91.9 kg) 10/04/19 : 205 lb (93 kg) 08/23/19 : 209 lb 3.2 oz (94.9 kg)     Past Medical History:  Diagnosis Date  . GERD (gastroesophageal reflux disease)   . Hyperlipidemia   . Iron deficiency anemia   . Obesity      Family History  Problem Relation Age of Onset  . Colon cancer Mother   . Multiple sclerosis Father   . Diabetes Father   . Hypertension Father   . Sarcoidosis Sister   . Diabetes Sister   . Hypertension Sister   . Healthy Son   . Breast cancer Neg Hx      Current Outpatient Medications:  .  omeprazole (PRILOSEC) 20 MG capsule, Take 20 mg by mouth daily., Disp: , Rfl:  .  ferrous sulfate 325 (65 FE) MG EC tablet, Take 1 tablet (325 mg total) by mouth 2 (two) times daily., Disp: 60 tablet, Rfl: 3 .  phentermine 37.5 MG capsule, 1/2 to 1 30 minutes before breakfast (Patient not taking: Reported on 04/14/2020), Disp: 30 capsule, Rfl: 0   No Known Allergies    The patient states she uses tubal ligation for birth control.  Patient's last menstrual period was 03/29/2020.. Negative for Dysmenorrhea and Negative for Menorrhagia. Negative for: breast discharge, breast lump(s), breast pain and breast self exam. Associated symptoms include abnormal vaginal bleeding. Pertinent negatives include abnormal bleeding (hematology), anxiety, decreased  libido, depression, difficulty falling sleep, dyspareunia, history of infertility, nocturia, sexual dysfunction, sleep disturbances, urinary incontinence, urinary urgency, vaginal discharge and vaginal itching. Diet regular. The patient states her exercise level is moderate - floor exercises.    The patient's tobacco use is:  Social History   Tobacco Use  Smoking Status Never Smoker  Smokeless Tobacco Never Used   She has been exposed to passive smoke. The patient's alcohol use is:  Social History   Substance and Sexual Activity  Alcohol Use No   Additional information: Last pap 01/10/2018 next one scheduled for 01/10/2021  Review of Systems  Constitutional: Negative.   HENT: Negative.   Eyes: Negative.   Respiratory: Negative.   Cardiovascular: Negative.   Gastrointestinal: Negative.   Endocrine: Negative.   Genitourinary: Negative.   Musculoskeletal: Negative.   Skin: Negative.   Allergic/Immunologic: Negative.   Neurological: Negative.   Hematological: Negative.   Psychiatric/Behavioral: Negative.      Today's Vitals   04/14/20 1457  BP: 122/76  Pulse: 68  Temp: 98.2 F (36.8 C)  TempSrc: Oral  Weight: 202 lb 9.6 oz (91.9 kg)  Height: 5' 0.8" (1.544 m)  PainSc: 0-No pain   Body mass index is 38.53 kg/m.   Objective:  Physical Exam Constitutional:      General: She is not in acute distress.    Appearance: Normal appearance. She  is well-developed. She is obese.  HENT:     Head: Normocephalic and atraumatic.     Right Ear: Hearing, tympanic membrane, ear canal and external ear normal. There is no impacted cerumen.     Left Ear: Hearing, tympanic membrane, ear canal and external ear normal. There is no impacted cerumen.     Nose:     Comments: Deferred - masked     Mouth/Throat:     Comments: Deferred - masked Eyes:     General: Lids are normal.     Extraocular Movements: Extraocular movements intact.     Conjunctiva/sclera: Conjunctivae normal.      Pupils: Pupils are equal, round, and reactive to light.     Funduscopic exam:    Right eye: No papilledema.        Left eye: No papilledema.  Neck:     Thyroid: No thyroid mass.     Vascular: No carotid bruit.  Cardiovascular:     Rate and Rhythm: Normal rate and regular rhythm.     Pulses: Normal pulses.     Heart sounds: Normal heart sounds. No murmur heard.   Pulmonary:     Effort: Pulmonary effort is normal.     Breath sounds: Normal breath sounds.  Chest:     Chest wall: No mass.     Breasts: Tanner Score is 5.        Right: Mass (nodules palpated at 2 oclock) present. No tenderness.        Left: Mass (nodule palpated at 4 oclock) present. No tenderness.  Abdominal:     General: Abdomen is flat. Bowel sounds are normal. There is no distension.     Palpations: Abdomen is soft.     Tenderness: There is no abdominal tenderness.  Genitourinary:    Rectum: Guaiac result negative.  Musculoskeletal:        General: No swelling. Normal range of motion.     Cervical back: Full passive range of motion without pain, normal range of motion and neck supple.     Right lower leg: No edema.     Left lower leg: No edema.  Skin:    General: Skin is warm and dry.     Capillary Refill: Capillary refill takes less than 2 seconds.  Neurological:     General: No focal deficit present.     Mental Status: She is alert and oriented to person, place, and time.     Cranial Nerves: No cranial nerve deficit.     Sensory: No sensory deficit.  Psychiatric:        Mood and Affect: Mood normal.        Behavior: Behavior normal.        Thought Content: Thought content normal.        Judgment: Judgment normal.         Assessment And Plan:     1. Encounter for general adult medical examination w/o abnormal findings . Behavior modifications discussed and diet history reviewed.   . Pt will continue to exercise regularly and modify diet with low GI, plant based foods and decrease intake of processed  foods.  . Recommend intake of daily multivitamin, Vitamin D, and calcium.  . Recommend mammogram for preventive screenings, as well as recommend immunizations that include influenza, TDAP  - CBC  2. Mixed hyperlipidemia  Chronic, controlled  Continue with current medications - CMP14+EGFR - Lipid panel  3. Iron deficiency anemia, unspecified iron deficiency anemia type  Chronic,  continue with iron supplement - CMP14+EGFR - CBC - ferrous sulfate 325 (65 FE) MG EC tablet; Take 1 tablet (325 mg total) by mouth 2 (two) times daily.  Dispense: 60 tablet; Refill: 3 - Iron, TIBC and Ferritin Panel  4. Morbid obesity ([place])  Discussed the importance of exercising regularly at least 150 minutes per week  Also discussed the importance of healthy diet and drinking adequate water intake  5. Breast nodule  Bilateral breast with multiple firm nodules will check diagnostic mammogram - MM Digital Diagnostic Bilat; Future  6. Encounter for hepatitis C screening test for low risk patient  Will check Hepatitis C screening due to recent recommendations to screen all adults 18 years and older - Hepatitis C antibody   Patient was given opportunity to ask questions. Patient verbalized understanding of the plan and was able to repeat key elements of the plan. All questions were answered to their satisfaction.    Teola Bradley, FNP, have reviewed all documentation for this visit. The documentation on 04/27/20 for the exam, diagnosis, procedures, and orders are all accurate and complete.   THE PATIENT IS ENCOURAGED TO PRACTICE SOCIAL DISTANCING DUE TO THE COVID-19 PANDEMIC.

## 2020-04-14 NOTE — Patient Instructions (Signed)
Health Maintenance, Female Adopting a healthy lifestyle and getting preventive care are important in promoting health and wellness. Ask your health care provider about:  The right schedule for you to have regular tests and exams.  Things you can do on your own to prevent diseases and keep yourself healthy. What should I know about diet, weight, and exercise? Eat a healthy diet   Eat a diet that includes plenty of vegetables, fruits, low-fat dairy products, and lean protein.  Do not eat a lot of foods that are high in solid fats, added sugars, or sodium. Maintain a healthy weight Body mass index (BMI) is used to identify weight problems. It estimates body fat based on height and weight. Your health care provider can help determine your BMI and help you achieve or maintain a healthy weight. Get regular exercise Get regular exercise. This is one of the most important things you can do for your health. Most adults should:  Exercise for at least 150 minutes each week. The exercise should increase your heart rate and make you sweat (moderate-intensity exercise).  Do strengthening exercises at least twice a week. This is in addition to the moderate-intensity exercise.  Spend less time sitting. Even light physical activity can be beneficial. Watch cholesterol and blood lipids Have your blood tested for lipids and cholesterol at 47 years of age, then have this test every 5 years. Have your cholesterol levels checked more often if:  Your lipid or cholesterol levels are high.  You are older than 47 years of age.  You are at high risk for heart disease. What should I know about cancer screening? Depending on your health history and family history, you may need to have cancer screening at various ages. This may include screening for:  Breast cancer.  Cervical cancer.  Colorectal cancer.  Skin cancer.  Lung cancer. What should I know about heart disease, diabetes, and high blood  pressure? Blood pressure and heart disease  High blood pressure causes heart disease and increases the risk of stroke. This is more likely to develop in people who have high blood pressure readings, are of African descent, or are overweight.  Have your blood pressure checked: ? Every 3-5 years if you are 18-39 years of age. ? Every year if you are 40 years old or older. Diabetes Have regular diabetes screenings. This checks your fasting blood sugar level. Have the screening done:  Once every three years after age 40 if you are at a normal weight and have a low risk for diabetes.  More often and at a younger age if you are overweight or have a high risk for diabetes. What should I know about preventing infection? Hepatitis B If you have a higher risk for hepatitis B, you should be screened for this virus. Talk with your health care provider to find out if you are at risk for hepatitis B infection. Hepatitis C Testing is recommended for:  Everyone born from 1945 through 1965.  Anyone with known risk factors for hepatitis C. Sexually transmitted infections (STIs)  Get screened for STIs, including gonorrhea and chlamydia, if: ? You are sexually active and are younger than 47 years of age. ? You are older than 47 years of age and your health care provider tells you that you are at risk for this type of infection. ? Your sexual activity has changed since you were last screened, and you are at increased risk for chlamydia or gonorrhea. Ask your health care provider if   you are at risk.  Ask your health care provider about whether you are at high risk for HIV. Your health care provider may recommend a prescription medicine to help prevent HIV infection. If you choose to take medicine to prevent HIV, you should first get tested for HIV. You should then be tested every 3 months for as long as you are taking the medicine. Pregnancy  If you are about to stop having your period (premenopausal) and  you may become pregnant, seek counseling before you get pregnant.  Take 400 to 800 micrograms (mcg) of folic acid every day if you become pregnant.  Ask for birth control (contraception) if you want to prevent pregnancy. Osteoporosis and menopause Osteoporosis is a disease in which the bones lose minerals and strength with aging. This can result in bone fractures. If you are 65 years old or older, or if you are at risk for osteoporosis and fractures, ask your health care provider if you should:  Be screened for bone loss.  Take a calcium or vitamin D supplement to lower your risk of fractures.  Be given hormone replacement therapy (HRT) to treat symptoms of menopause. Follow these instructions at home: Lifestyle  Do not use any products that contain nicotine or tobacco, such as cigarettes, e-cigarettes, and chewing tobacco. If you need help quitting, ask your health care provider.  Do not use street drugs.  Do not share needles.  Ask your health care provider for help if you need support or information about quitting drugs. Alcohol use  Do not drink alcohol if: ? Your health care provider tells you not to drink. ? You are pregnant, may be pregnant, or are planning to become pregnant.  If you drink alcohol: ? Limit how much you use to 0-1 drink a day. ? Limit intake if you are breastfeeding.  Be aware of how much alcohol is in your drink. In the [place] one drink equals one 12 oz bottle of beer (355 mL), one 5 oz glass of wine (148 mL), or one 1 oz glass of hard liquor (44 mL). General instructions  Schedule regular health, dental, and eye exams.  Stay current with your vaccines.  Tell your health care provider if: ? You often feel depressed. ? You have ever been abused or do not feel safe at home. Summary  Adopting a healthy lifestyle and getting preventive care are important in promoting health and wellness.  Follow your health care provider's instructions about healthy  diet, exercising, and getting tested or screened for diseases.  Follow your health care provider's instructions on monitoring your cholesterol and blood pressure. This information is not intended to replace advice given to you by your health care provider. Make sure you discuss any questions you have with your health care provider. Document Revised: 06/28/2018 Document Reviewed: 06/28/2018 Elsevier Patient Education  2020 [employer]  

## 2020-04-27 ENCOUNTER — Encounter: Payer: Self-pay | Admitting: Nurse Practitioner

## 2020-04-27 MED ORDER — FERROUS SULFATE 325 (65 FE) MG PO TBEC: 325.0000 mg | DELAYED_RELEASE_TABLET | Freq: Two times a day (BID) | ORAL | 3 refills | Status: DC

## 2020-05-01 ENCOUNTER — Other Ambulatory Visit: Payer: Self-pay | Admitting: Nurse Practitioner

## 2020-05-01 DIAGNOSIS — N63 Unspecified lump in unspecified breast: Secondary | ICD-10-CM

## 2020-05-02 LAB — CBC
Hematocrit: 34.7 % (ref 34.0–46.6)
Hemoglobin: 11.5 g/dL (ref 11.1–15.9)
MCH: 32.4 pg (ref 26.6–33.0)
MCHC: 33.1 g/dL (ref 31.5–35.7)
MCV: 98 fL — ABNORMAL HIGH (ref 79–97)
Platelets: 317 10*3/uL (ref 150–450)
RBC: 3.55 x10E6/uL — ABNORMAL LOW (ref 3.77–5.28)
RDW: 12.6 % (ref 11.7–15.4)
WBC: 5.8 10*3/uL (ref 3.4–10.8)

## 2020-05-02 LAB — HEPATITIS C ANTIBODY: Hep C Virus Ab: 0.1 s/co ratio (ref 0.0–0.9)

## 2020-05-02 LAB — CMP14+EGFR
ALT: 9 IU/L (ref 0–32)
AST: 16 IU/L (ref 0–40)
Albumin/Globulin Ratio: 0.9 — ABNORMAL LOW (ref 1.2–2.2)
Albumin: 3.5 g/dL — ABNORMAL LOW (ref 3.8–4.8)
Alkaline Phosphatase: 45 IU/L (ref 44–121)
BUN/Creatinine Ratio: 18 (ref 9–23)
BUN: 11 mg/dL (ref 6–24)
Bilirubin Total: <0.2 mg/dL (ref 0.0–1.2)
CO2: 24 mmol/L (ref 20–29)
Calcium: 8.9 mg/dL (ref 8.7–10.2)
Chloride: 103 mmol/L (ref 96–106)
Creatinine, Ser: 0.6 mg/dL (ref 0.57–1.00)
GFR calc Af Amer: 126 mL/min/{1.73_m2} (ref >59)
GFR calc non Af Amer: 109 mL/min/{1.73_m2} (ref >59)
Globulin, Total: 4 g/dL (ref 1.5–4.5)
Glucose: 96 mg/dL (ref 65–99)
Potassium: 4.4 mmol/L (ref 3.5–5.2)
Sodium: 137 mmol/L (ref 134–144)
Total Protein: 7.5 g/dL (ref 6.0–8.5)

## 2020-05-02 LAB — LIPID PANEL
Chol/HDL Ratio: 2.7 ratio (ref 0.0–4.4)
Cholesterol, Total: 128 mg/dL (ref 100–199)
HDL: 47 mg/dL (ref >39)
LDL Chol Calc (NIH): 56 mg/dL (ref 0–99)
Triglycerides: 145 mg/dL (ref 0–149)
VLDL Cholesterol Cal: 25 mg/dL (ref 5–40)

## 2020-05-02 LAB — IRON,TIBC AND FERRITIN PANEL
Ferritin: 50 ng/mL (ref 15–150)
Iron Saturation: 47 % (ref 15–55)
Iron: 108 ug/dL (ref 27–159)
Total Iron Binding Capacity: 228 ug/dL — ABNORMAL LOW (ref 250–450)
UIBC: 120 ug/dL — ABNORMAL LOW (ref 131–425)

## 2020-05-15 ENCOUNTER — Ambulatory Visit
Admission: RE | Admit: 2020-05-15 | Discharge: 2020-05-15 | Disposition: A | Discharge status: Home / Self Care | Payer: 59 | Source: Ambulatory Visit | Attending: Nurse Practitioner | Admitting: Nurse Practitioner

## 2020-05-15 ENCOUNTER — Ambulatory Visit
Admission: RE | Admit: 2020-05-15 | Discharge: 2020-05-15 | Disposition: A | Discharge status: Home / Self Care | Payer: No Typology Code available for payment source | Source: Ambulatory Visit | Attending: Nurse Practitioner | Admitting: Nurse Practitioner

## 2020-05-15 ENCOUNTER — Other Ambulatory Visit: Payer: Self-pay

## 2020-05-15 DIAGNOSIS — N63 Unspecified lump in unspecified breast: Secondary | ICD-10-CM

## 2020-05-26 ENCOUNTER — Emergency Department (HOSPITAL_COMMUNITY)
Admission: EM | Admit: 2020-05-26 | Discharge: 2020-05-27 | Disposition: A | Discharge status: Home / Self Care | Payer: No Typology Code available for payment source | Attending: Emergency Medicine | Admitting: Emergency Medicine

## 2020-05-26 ENCOUNTER — Emergency Department (HOSPITAL_COMMUNITY): Payer: No Typology Code available for payment source

## 2020-05-26 ENCOUNTER — Other Ambulatory Visit: Payer: Self-pay

## 2020-05-26 ENCOUNTER — Encounter (HOSPITAL_COMMUNITY): Payer: Self-pay | Admitting: Emergency Medicine

## 2020-05-26 DIAGNOSIS — R1110 Vomiting, unspecified: Secondary | ICD-10-CM | POA: Diagnosis present

## 2020-05-26 DIAGNOSIS — F1290 Cannabis use, unspecified, uncomplicated: Secondary | ICD-10-CM | POA: Diagnosis not present

## 2020-05-26 DIAGNOSIS — R4182 Altered mental status, unspecified: Secondary | ICD-10-CM | POA: Insufficient documentation

## 2020-05-26 DIAGNOSIS — Z20822 Contact with and (suspected) exposure to covid-19: Secondary | ICD-10-CM | POA: Diagnosis not present

## 2020-05-26 DIAGNOSIS — F10129 Alcohol abuse with intoxication, unspecified: Secondary | ICD-10-CM | POA: Diagnosis not present

## 2020-05-26 LAB — CBG MONITORING, ED: Glucose-Capillary: 114 mg/dL — ABNORMAL HIGH (ref 70–99)

## 2020-05-26 LAB — ACETAMINOPHEN LEVEL: Acetaminophen (Tylenol), Serum: <10 ug/mL — ABNORMAL LOW (ref 10–30)

## 2020-05-26 LAB — COMPREHENSIVE METABOLIC PANEL
ALT: 14 U/L (ref 0–44)
AST: 29 U/L (ref 15–41)
Albumin: 3 g/dL — ABNORMAL LOW (ref 3.5–5.0)
Alkaline Phosphatase: 30 U/L — ABNORMAL LOW (ref 38–126)
Anion gap: 11 (ref 5–15)
BUN: 11 mg/dL (ref 6–20)
CO2: 17 mmol/L — ABNORMAL LOW (ref 22–32)
Calcium: 8.4 mg/dL — ABNORMAL LOW (ref 8.9–10.3)
Chloride: 107 mmol/L (ref 98–111)
Creatinine, Ser: 0.8 mg/dL (ref 0.44–1.00)
GFR, Estimated: >60 mL/min (ref >60)
Glucose, Bld: 160 mg/dL — ABNORMAL HIGH (ref 70–99)
Potassium: 3.3 mmol/L — ABNORMAL LOW (ref 3.5–5.1)
Sodium: 135 mmol/L (ref 135–145)
Total Bilirubin: 0.6 mg/dL (ref 0.3–1.2)
Total Protein: 7.7 g/dL (ref 6.5–8.1)

## 2020-05-26 LAB — I-STAT BETA HCG BLOOD, ED (MC, WL, AP ONLY): I-stat hCG, quantitative: <5 m[IU]/mL (ref <5)

## 2020-05-26 LAB — ETHANOL: Alcohol, Ethyl (B): 35 mg/dL — ABNORMAL HIGH (ref <10)

## 2020-05-26 LAB — SALICYLATE LEVEL: Salicylate Lvl: <7 mg/dL — ABNORMAL LOW (ref 7.0–30.0)

## 2020-05-26 MED ORDER — SODIUM CHLORIDE 0.9 % IV BOLUS
1000.0000 mL | Freq: Once | INTRAVENOUS | Status: AC
  Administered 2020-05-26: 1000 mL via INTRAVENOUS

## 2020-05-26 NOTE — ED Notes (Signed)
Patient placed on pure wick °

## 2020-05-26 NOTE — ED Triage Notes (Signed)
Per EMS, pt from home, called out due to "stroke".  Pt found sitting up moaning w/ several episodes of emesis.  Son last saw her at 6:15 drinking wine, at 6:40 she started yelling to call 911 as she was "throwing up."  When pt is cooperative she is able to speak and can answer questions.  No neuro deficits at this time.  Does vagal down w/ her blood pressure w/ bouts of emesis.  Given 200 NS and 4 zofran.    94/60 90-100 HR 100% RA 97.7 temp.

## 2020-05-26 NOTE — ED Provider Notes (Signed)
[facility] EMERGENCY DEPARTMENT Provider Note   CSN: [id number] Arrival date & time: 05/26/20  1950     History Chief Complaint  Patient presents with  . Alcohol Intoxication  . Emesis  . Altered Mental Status    [name] is a 49 y.o. female with history significant for obesity, hyperlipidemia, GERD who presents for evaluation of altered mental status, possible intoxication.  Level 5 caveat-altered mental status  Per EMS, pt from home. Son states last seen at 615 today drinking wine.  At 640 patient started yelling to call 911 as she was "throwing up."  Patient found by EMS to the up moaning with several episodes of NBNB emesis.  EMS states patient cooperative, and able to answer questions.  Did states she did table down with bouts of emesis.  Was given 200 normal saline and 4 of Zofran. No urinary incontinency, seizure like activity  Attempted contact patient's son [name] at [phone] x [phone]. No answer.  HPI     Past Medical History:  Diagnosis Date  . GERD (gastroesophageal reflux disease)   . Hyperlipidemia   . Iron deficiency anemia   . Obesity     Patient Active Problem List   Diagnosis Date Noted  . Mixed hyperlipidemia 08/23/2019  . Iron deficiency anemia 04/22/2018  . Sciatic neuritis 09/27/2012  . Morbid obesity ([facility]) 09/27/2012    Past Surgical History:  Procedure Laterality Date  . CHOLECYSTECTOMY    . TUBAL LIGATION       OB History   No obstetric history on file.     Family History  Problem Relation Age of Onset  . Colon cancer Mother   . Multiple sclerosis Father   . Diabetes Father   . Hypertension Father   . Sarcoidosis Sister   . Diabetes Sister   . Hypertension Sister   . Healthy Son   . Breast cancer Neg Hx     Social History   Tobacco Use  . Smoking status: Never Smoker  . Smokeless tobacco: Never Used  Vaping Use  . Vaping Use: Never used  Substance Use Topics  . Alcohol use: No  . Drug  use: No    Home Medications Prior to Admission medications   Medication Sig Start Date End Date Taking? Authorizing Provider  ferrous sulfate 325 (65 FE) MG EC tablet Take 1 tablet (325 mg total) by mouth 2 (two) times daily. 04/27/20 04/27/21  Arnette Felts, FNP  omeprazole (PRILOSEC) 20 MG capsule Take 20 mg by mouth daily.    [provider]  phentermine 37.5 MG capsule 1/2 to 1 30 minutes before breakfast Patient not taking: Reported on 04/14/2020 08/23/19   Rodriguez-Southworth, Nettie Elm, PA-C    Allergies    Patient has no known allergies.  Review of Systems   Review of Systems  Unable to perform ROS: Mental status change    Physical Exam Updated Vital Signs BP 119/65   Pulse 99   Temp (!) 97.2 F (36.2 C) (Axillary)   Resp (!) 26   SpO2 100%   Physical Exam Vitals and nursing note reviewed.  Constitutional:      Appearance: She is well-developed. She is obese. She is not ill-appearing or toxic-appearing.  HENT:     Head: Normocephalic and atraumatic.     Mouth/Throat:     Lips: Pink.     Mouth: Mucous membranes are moist.     Pharynx: Oropharynx is clear. Uvula midline.  Comments: No tongue injury Eyes:     Pupils: Pupils are equal, round, and reactive to light.  Cardiovascular:     Rate and Rhythm: Normal rate.     Pulses: Normal pulses.          Radial pulses are 2+ on the right side and 2+ on the left side.       Dorsalis pedis pulses are 2+ on the right side and 2+ on the left side.       Posterior tibial pulses are 2+ on the right side and 2+ on the left side.     Heart sounds: Normal heart sounds.  Pulmonary:     Effort: Pulmonary effort is normal. No respiratory distress.     Breath sounds: Normal breath sounds.  Abdominal:     General: Bowel sounds are normal. There is no distension.     Tenderness: There is no abdominal tenderness. There is no guarding or rebound.  Musculoskeletal:        General: No swelling, tenderness, deformity or  signs of injury. Normal range of motion.     Cervical back: Normal range of motion.     Right lower leg: No edema.     Left lower leg: No edema.  Skin:    General: Skin is warm and dry.     Capillary Refill: Capillary refill takes less than 2 seconds.  Neurological:     Mental Status: She is alert.     Comments: Intermittently follows commands however slow to respond. Raises bilateral arms overhead. Wiggles toes. PERLLA. Withdraws to pain.     ED Results / Procedures / Treatments   Labs (all labs ordered are listed, but only abnormal results are displayed) Labs Reviewed  COMPREHENSIVE METABOLIC PANEL - Abnormal; Notable for the following components:      Result Value   Potassium 3.3 (*)    CO2 17 (*)    Glucose, Bld 160 (*)    Calcium 8.4 (*)    Albumin 3.0 (*)    Alkaline Phosphatase 30 (*)    All other components within normal limits  ETHANOL - Abnormal; Notable for the following components:   Alcohol, Ethyl (B) 35 (*)    All other components within normal limits  SALICYLATE LEVEL - Abnormal; Notable for the following components:   Salicylate Lvl <7.0 (*)    All other components within normal limits  ACETAMINOPHEN LEVEL - Abnormal; Notable for the following components:   Acetaminophen (Tylenol), Serum <10 (*)    All other components within normal limits  CBG MONITORING, ED - Abnormal; Notable for the following components:   Glucose-Capillary 114 (*)    All other components within normal limits  CBC WITH DIFFERENTIAL/PLATELET  URINALYSIS, ROUTINE W REFLEX MICROSCOPIC  RAPID URINE DRUG SCREEN, HOSP PERFORMED  CBC WITH DIFFERENTIAL/PLATELET  I-STAT BETA HCG BLOOD, ED ([facility], [facility], [facility] ONLY)    EKG EKG Interpretation  Date/Time:  Monday May 26 2020 19:58:42 EST Ventricular Rate:  92 PR Interval:    QRS Duration: 94 QT Interval:  380 QTC Calculation: 471 R Axis:   29 Text Interpretation: Sinus rhythm No previous ECGs available Confirmed by Alvira Monday ([id number])  on 05/26/2020 9:53:28 PM   Radiology CT Head Wo Contrast  Result Date: 05/26/2020 CLINICAL DATA:  Delirium EXAM: CT HEAD WITHOUT CONTRAST TECHNIQUE: Contiguous axial images were obtained from the base of the skull through the vertex without intravenous contrast. COMPARISON:  None. FINDINGS: Brain: No acute infarct or hemorrhage. Lateral  ventricles and midline structures are unremarkable. No acute extra-axial fluid collections. No mass effect. Vascular: No hyperdense vessel or unexpected calcification. Skull: Normal. Negative for fracture or focal lesion. Sinuses/Orbits: No acute finding. Other: None. IMPRESSION: 1. No acute intracranial process. Electronically Signed   By: Sharlet Salina M.D.   On: 05/26/2020 20:45   DG Chest Portable 1 View  Result Date: 05/26/2020 CLINICAL DATA:  49 year old female with altered mental status. EXAM: PORTABLE CHEST 1 VIEW COMPARISON:  None. FINDINGS: Faint diffuse interstitial densities may represent atelectasis or atypical infection. Clinical correlation is recommended. No focal consolidation, pleural effusion, or pneumothorax. Borderline cardiomegaly. No acute osseous pathology. IMPRESSION: 1. No focal consolidation. 2. Faint diffuse interstitial densities may represent atelectasis or atypical infection. Electronically Signed   By: Elgie Collard M.D.   On: 05/26/2020 21:22    Procedures Procedures (including critical care time)  Medications Ordered in ED Medications  sodium chloride 0.9 % bolus 1,000 mL (1,000 mLs Intravenous New Bag/Given 05/26/20 2109)   ED Course  I have reviewed the triage vital signs and the nursing notes.  Pertinent labs & imaging results that were available during my care of the patient were reviewed by me and considered in my medical decision making (see chart for details).  49 year old female presents for evaluation of altered mental status.  Apparently was drinking wine and started having emesis.  On arrival patient is altered.   She intermittently follows commands.  She has no facial droop.  Raises bilateral hands above head however does appear confused. Patient is not a code stroke. Plan on labs, imaging and reassessed.  Labs and imaging pesonaly reviewed and interpreted:  CT head without acute abnormality CBG 114 EKG No STEMI Preg negative Acetaminophen <10, Salicylate <7 Ethanol 35 CMP with hypokalemia at 3.3, hyperglycemia 160, albumin 3.0  no additional electrolyte renal or liver abnormality  Dg chest with possible atelectasis vs infiltrates  2115: Patient reassessed. Opens eyes to name. Follows some commands, equal hand grip bilaterally. No facial droop. Will not speak currently. Attempted to re-contact patient's son however still no answer. Ethanol only 35, UDS pending. No focal deficits on exam to suggest acute intracranial abnormality.  2145: Patient care transferred to attending Dr. Dalene Seltzer who will follow up on remaining labs, imaging and determine disposition.     MDM Rules/Calculators/A&P                           Final Clinical Impression(s) / ED Diagnoses Final diagnoses:  Altered mental status, unspecified altered mental status type    Rx / DC Orders ED Discharge Orders    None       Tiffanny Lamarche A, PA-C 05/26/20 2159    Alvira Monday, MD 05/27/20 [phone]

## 2020-05-26 NOTE — ED Notes (Signed)
Pt awake.  She is able to say her name but is restrained on giving information.  She is alert and oriented at this time.  Son bedside.

## 2020-05-26 NOTE — ED Notes (Signed)
Pt in CT.

## 2020-05-27 LAB — CBC WITH DIFFERENTIAL/PLATELET
Abs Immature Granulocytes: 0.04 10*3/uL (ref 0.00–0.07)
Basophils Absolute: 0 10*3/uL (ref 0.0–0.1)
Basophils Relative: 0 %
Eosinophils Absolute: 0 10*3/uL (ref 0.0–0.5)
Eosinophils Relative: 0 %
HCT: 35 % — ABNORMAL LOW (ref 36.0–46.0)
Hemoglobin: 11.4 g/dL — ABNORMAL LOW (ref 12.0–15.0)
Immature Granulocytes: 0 %
Lymphocytes Relative: 8 %
Lymphs Abs: 0.7 10*3/uL (ref 0.7–4.0)
MCH: 31.7 pg (ref 26.0–34.0)
MCHC: 32.6 g/dL (ref 30.0–36.0)
MCV: 97.2 fL (ref 80.0–100.0)
Monocytes Absolute: 0.3 10*3/uL (ref 0.1–1.0)
Monocytes Relative: 3 %
Neutro Abs: 7.9 10*3/uL — ABNORMAL HIGH (ref 1.7–7.7)
Neutrophils Relative %: 89 %
Platelets: 318 10*3/uL (ref 150–400)
RBC: 3.6 MIL/uL — ABNORMAL LOW (ref 3.87–5.11)
RDW: 13.7 % (ref 11.5–15.5)
WBC: 9 10*3/uL (ref 4.0–10.5)
nRBC: 0 % (ref 0.0–0.2)

## 2020-05-27 LAB — URINALYSIS, ROUTINE W REFLEX MICROSCOPIC
Bilirubin Urine: NEGATIVE
Glucose, UA: NEGATIVE mg/dL
Hgb urine dipstick: NEGATIVE
Ketones, ur: NEGATIVE mg/dL
Leukocytes,Ua: NEGATIVE
Nitrite: NEGATIVE
Protein, ur: NEGATIVE mg/dL
Specific Gravity, Urine: 1.015 (ref 1.005–1.030)
pH: 6 (ref 5.0–8.0)

## 2020-05-27 LAB — RAPID URINE DRUG SCREEN, HOSP PERFORMED
Amphetamines: NOT DETECTED
Barbiturates: NOT DETECTED
Benzodiazepines: NOT DETECTED
Cocaine: NOT DETECTED
Opiates: NOT DETECTED
Tetrahydrocannabinol: POSITIVE — AB

## 2020-05-27 LAB — RESPIRATORY PANEL BY RT PCR (FLU A&B, COVID)
Influenza A by PCR: NEGATIVE
Influenza B by PCR: NEGATIVE
SARS Coronavirus 2 by RT PCR: NEGATIVE

## 2020-05-27 MED ORDER — POTASSIUM CHLORIDE CRYS ER 20 MEQ PO TBCR
40.0000 meq | EXTENDED_RELEASE_TABLET | Freq: Once | ORAL | Status: AC
  Administered 2020-05-27: 40 meq via ORAL
  Filled 2020-05-27: qty 2

## 2020-05-27 NOTE — ED Notes (Addendum)
Pt able to ambulate without assistance ,pt denies being SOB or dizziness at this time.

## 2020-05-29 ENCOUNTER — Telehealth: Payer: Self-pay

## 2020-05-29 NOTE — Telephone Encounter (Signed)
I left pt v/m to call the office we wanted to see how she was doing since ER visit. YL,RMA

## 2020-06-30 ENCOUNTER — Other Ambulatory Visit: Payer: Self-pay

## 2020-06-30 ENCOUNTER — Ambulatory Visit (INDEPENDENT_AMBULATORY_CARE_PROVIDER_SITE_OTHER): Payer: No Typology Code available for payment source | Admitting: Nurse Practitioner

## 2020-06-30 ENCOUNTER — Encounter: Payer: Self-pay | Admitting: Nurse Practitioner

## 2020-06-30 VITALS — BP 122/82 | HR 94 | Temp 98.6°F | Ht 60.8 in | Wt 201.4 lb

## 2020-06-30 DIAGNOSIS — R3129 Other microscopic hematuria: Secondary | ICD-10-CM | POA: Diagnosis not present

## 2020-06-30 DIAGNOSIS — R109 Unspecified abdominal pain: Secondary | ICD-10-CM

## 2020-06-30 DIAGNOSIS — R1084 Generalized abdominal pain: Secondary | ICD-10-CM

## 2020-06-30 LAB — POCT URINALYSIS DIPSTICK
Bilirubin, UA: NEGATIVE
Glucose, UA: NEGATIVE
Ketones, UA: NEGATIVE
Leukocytes, UA: NEGATIVE
Nitrite, UA: NEGATIVE
Protein, UA: NEGATIVE
Spec Grav, UA: >=1.03 — AB (ref 1.010–1.025)
Urobilinogen, UA: 0.2 E.U./dL
pH, UA: 5.5 (ref 5.0–8.0)

## 2020-06-30 NOTE — Progress Notes (Signed)
I,Yamilka Roman [employer] as a Neurosurgeon for [employer], FNP.,have documented all relevant documentation on the behalf of Arnette Felts, FNP,as directed by  Arnette Felts, FNP while in the presence of Arnette Felts, FNP. This visit occurred during the SARS-CoV-2 public health emergency.  Safety protocols were in place, including screening questions prior to the visit, additional usage of staff PPE, and extensive cleaning of exam room while observing appropriate contact time as indicated for disinfecting solutions.  Subjective:     Patient ID: [name] , female    DOB: 1971/03/20 , 49 y.o.   MRN: [id number]   Chief Complaint  Patient presents with  . Abdominal Pain    Patient stated she was having some stomach pain that started Wednesday through Sunday. Patient stated she feels like she has gas build up she has been able to go to the bathroom but she has to push more than usual. She also reports some left flank pain    HPI  She does report when trying to have a bowel movement she has to push quite a bit. She works 2 jobs one is sedentary and the other is active.  She does not have her gallbladder.  Abdominal Pain This is a new problem. The current episode started in the past 7 days (since Wednesday). The pain is located in the generalized abdominal region (left lateral abdomen pain radiating down to left groin, generalized abdomen pain has improved over the last couple days). The patient is experiencing no pain. The quality of the pain is aching. Pertinent negatives include no frequency, headaches or hematuria. She has tried acetaminophen (took gas x) for the symptoms. The treatment provided moderate relief.     Past Medical History:  Diagnosis Date  . GERD (gastroesophageal reflux disease)   . Hyperlipidemia   . Iron deficiency anemia   . Obesity      Family History  Problem Relation Age of Onset  . Colon cancer Mother   . Multiple sclerosis Father   . Diabetes Father   .  Hypertension Father   . Sarcoidosis Sister   . Diabetes Sister   . Hypertension Sister   . Healthy Son   . Breast cancer Neg Hx      Current Outpatient Medications:  .  ferrous sulfate 325 (65 FE) MG EC tablet, Take 1 tablet (325 mg total) by mouth 2 (two) times daily., Disp: 60 tablet, Rfl: 3 .  omeprazole (PRILOSEC) 20 MG capsule, Take 20 mg by mouth daily., Disp: , Rfl:  .  phentermine 37.5 MG capsule, 1/2 to 1 30 minutes before breakfast (Patient not taking: No sig reported), Disp: 30 capsule, Rfl: 0   No Known Allergies   Review of Systems  Cardiovascular: Negative.   Gastrointestinal: Positive for abdominal pain.  Endocrine: Negative for polydipsia, polyphagia and polyuria.  Genitourinary: Negative for frequency and hematuria.       November 26th LMP  Neurological: Negative for dizziness and headaches.  Psychiatric/Behavioral: Negative.      Today's Vitals   06/30/20 1600  BP: 122/82  Pulse: 94  Temp: 98.6 F (37 C)  TempSrc: Oral  Weight: 201 lb 6.4 oz (91.4 kg)  Height: 5' 0.8" (1.544 m)  PainSc: 0-No pain   Body mass index is 38.3 kg/m.   Objective:  Physical Exam Vitals reviewed.  Constitutional:      General: She is not in acute distress.    Appearance: She is well-developed. She is obese.  Abdominal:  General: Bowel sounds are normal.     Palpations: Abdomen is soft.     Tenderness: There is no abdominal tenderness. There is no right CVA tenderness or left CVA tenderness.  Skin:    General: Skin is warm and dry.     Capillary Refill: Capillary refill takes less than 2 seconds.  Neurological:     General: No focal deficit present.     Mental Status: She is alert.  Psychiatric:        Mood and Affect: Mood normal. Mood is not anxious.        Behavior: Behavior normal.         Assessment And Plan:     1. Generalized abdominal pain  Abdomen pain has since resolved, no abnormal findings on physical exam  If returns will consider labs and  imaging studies  2. Flank pain  Negative CVA tenderness  She had trace blood will send for culture  - POCT Urinalysis Dipstick (81002) - Culture, Urine  3. Other microscopic hematuria  Pending results will consider renal ultrasound as she has had trace blood in her urine the last 3 times done - Culture, Urine     Patient was given opportunity to ask questions. Patient verbalized understanding of the plan and was able to repeat key elements of the plan. All questions were answered to their satisfaction.   Jeanell Sparrow, FNP, have reviewed all documentation for this visit. The documentation on 07/06/20 for the exam, diagnosis, procedures, and orders are all accurate and complete.  THE PATIENT IS ENCOURAGED TO PRACTICE SOCIAL DISTANCING DUE TO THE COVID-19 PANDEMIC.

## 2021-02-10 ENCOUNTER — Encounter: Payer: Self-pay | Admitting: Nurse Practitioner

## 2021-02-10 ENCOUNTER — Ambulatory Visit (INDEPENDENT_AMBULATORY_CARE_PROVIDER_SITE_OTHER): Payer: No Typology Code available for payment source | Admitting: Nurse Practitioner

## 2021-02-10 ENCOUNTER — Other Ambulatory Visit: Payer: Self-pay

## 2021-02-10 VITALS — BP 118/80 | HR 98 | Temp 98.5°F | Ht 60.0 in | Wt 196.8 lb

## 2021-02-10 DIAGNOSIS — R197 Diarrhea, unspecified: Secondary | ICD-10-CM | POA: Diagnosis not present

## 2021-02-10 DIAGNOSIS — R109 Unspecified abdominal pain: Secondary | ICD-10-CM

## 2021-02-10 NOTE — Progress Notes (Signed)
I, ,acting as a Education administrator for Minette Brine, FNP.,have documented all relevant documentation on the behalf of Minette Brine, FNP,as directed by  Minette Brine, FNP while in the presence of Minette Brine, [place].  This visit occurred during the SARS-CoV-2 public health emergency.  Safety protocols were in place, including screening questions prior to the visit, additional usage of staff PPE, and extensive cleaning of exam room while observing appropriate contact time as indicated for disinfecting solutions.  Subjective:     Patient ID: [name] , female    DOB: 26-Sep-1970 , 50 y.o.   MRN: [id number]   Chief Complaint  Patient presents with   Diarrhea     HPI  Pt is here for stomach issues, mainly diarrhea is what she is currently experiencing. The last time she was here she was constipated took a laxative and improved.   Diarrhea  This is a new problem. The current episode started 1 to 4 weeks ago (2 weeks). The problem occurs 5 to 10 times per day (about 8 times). The patient states that diarrhea does not awaken her from sleep. Pertinent negatives include no abdominal pain, arthralgias, bloating, chills, coughing, headaches, myalgias or weight loss. Treatments tried: has taken immodium. There is no history of bowel resection.    Past Medical History:  Diagnosis Date   GERD (gastroesophageal reflux disease)    Hyperlipidemia    Iron deficiency anemia    Obesity      Family History  Problem Relation Age of Onset   Colon cancer Mother    Multiple sclerosis Father    Diabetes Father    Hypertension Father    Sarcoidosis Sister    Diabetes Sister    Hypertension Sister    Healthy Son    Breast cancer Neg Hx      Current Outpatient Medications:    ferrous sulfate 325 (65 FE) MG EC tablet, Take 1 tablet (325 mg total) by mouth 2 (two) times daily., Disp: 60 tablet, Rfl: 3   omeprazole (PRILOSEC) 20 MG capsule, Take 20 mg by mouth daily. (Patient not taking: Reported on  02/10/2021), Disp: , Rfl:    phentermine 37.5 MG capsule, 1/2 to 1 30 minutes before breakfast (Patient not taking: No sig reported), Disp: 30 capsule, Rfl: 0   No Known Allergies   Review of Systems  Constitutional:  Negative for chills and weight loss.  Respiratory:  Negative for cough and wheezing.   Cardiovascular: Negative.  Negative for chest pain, palpitations and leg swelling.  Gastrointestinal:  Positive for diarrhea. Negative for abdominal pain and bloating.  Musculoskeletal:  Negative for arthralgias and myalgias.  Neurological:  Negative for dizziness and headaches.  Psychiatric/Behavioral: Negative.      Today's Vitals   02/10/21 1654  BP: 118/80  Pulse: 98  Temp: 98.5 F (36.9 C)  Weight: 196 lb 12.8 oz (89.3 kg)  Height: 5' (1.524 m)   Body mass index is 38.43 kg/m.  Wt Readings from Last 3 Encounters:  02/10/21 196 lb 12.8 oz (89.3 kg)  06/30/20 201 lb 6.4 oz (91.4 kg)  04/14/20 202 lb 9.6 oz (91.9 kg)    Objective:  Physical Exam Vitals reviewed.  Constitutional:      General: She is not in acute distress.    Appearance: Normal appearance.  Cardiovascular:     Rate and Rhythm: Normal rate and regular rhythm.     Pulses: Normal pulses.     Heart sounds: Normal heart sounds. No murmur heard.  Pulmonary:     Effort: Pulmonary effort is normal. No respiratory distress.     Breath sounds: Normal breath sounds. No wheezing.  Abdominal:     General: Abdomen is flat. Bowel sounds are normal. There is no distension.     Palpations: Abdomen is soft. There is no mass.     Tenderness: There is no abdominal tenderness.  Skin:    General: Skin is warm and dry.     Capillary Refill: Capillary refill takes less than 2 seconds.     Coloration: Skin is not jaundiced.     Findings: No bruising.  Neurological:     General: No focal deficit present.     Mental Status: She is alert and oriented to person, place, and time.     Cranial Nerves: No cranial nerve deficit.      Motor: No weakness.  Psychiatric:        Mood and Affect: Mood normal.        Behavior: Behavior normal.        Thought Content: Thought content normal.        Judgment: Judgment normal.        Assessment And Plan:     1. Diarrhea, unspecified type Irritable bowel vs viral vs infectious Stay well hydrated with water and gatorade Take probiotic daily Will check for infection Will check covid test - Ova and parasite examination - Culture, Stool - Clostridium difficile Toxin A/B - BMP8+eGFR - CBC  2. Stomach discomfort No abnormal stomach discomfort on physical exam - Novel Coronavirus, NAA ([facility])    Patient was given opportunity to ask questions. Patient verbalized understanding of the plan and was able to repeat key elements of the plan. All questions were answered to their satisfaction.  Minette Brine, FNP   I, Minette Brine, FNP, have reviewed all documentation for this visit. The documentation on 02/10/21 for the exam, diagnosis, procedures, and orders are all accurate and complete.   IF YOU HAVE BEEN REFERRED TO A SPECIALIST, IT MAY TAKE 1-2 WEEKS TO SCHEDULE/PROCESS THE REFERRAL. IF YOU HAVE NOT HEARD FROM US/SPECIALIST IN TWO WEEKS, PLEASE GIVE [place] A CALL AT [phone].   THE PATIENT IS ENCOURAGED TO PRACTICE SOCIAL DISTANCING DUE TO THE COVID-19 PANDEMIC.

## 2021-02-10 NOTE — Patient Instructions (Addendum)
Diarrhea, Adult Diarrhea is frequent loose and watery bowel movements. Diarrhea can make you feel weak and cause you to become dehydrated. Dehydration can make you tiredand thirsty, cause you to have a dry mouth, and decrease how often you urinate. Diarrhea typically lasts 2-3 days. However, it can last longer if it is a sign of something more serious. It is important to treat your diarrhea as told byyour health care provider. Follow these instructions at home: Eating and drinking     Follow these recommendations as told by your health care provider: Take an oral rehydration solution (ORS). This is an over-the-counter medicine that helps return your body to its normal balance of nutrients and water. It is found at pharmacies and retail stores. Drink plenty of fluids, such as water, ice chips, diluted fruit juice, and low-calorie sports drinks. You can drink milk also, if desired. Avoid drinking fluids that contain a lot of sugar or caffeine, such as energy drinks, sports drinks, and soda. Eat bland, easy-to-digest foods in small amounts as you are able. These foods include bananas, applesauce, rice, lean meats, toast, and crackers. Avoid alcohol. Avoid spicy or fatty foods.  Medicines Take over-the-counter and prescription medicines only as told by your health care provider. If you were prescribed an antibiotic medicine, take it as told by your health care provider. Do not stop using the antibiotic even if you start to feel better. General instructions  Wash your hands often using soap and water. If soap and water are not available, use a hand sanitizer. Others in the household should wash their hands as well. Hands should be washed: After using the toilet or changing a diaper. Before preparing, cooking, or serving food. While caring for a sick person or while visiting someone in a hospital. Drink enough fluid to keep your urine pale yellow. Rest at home while you recover. Watch your  condition for any changes. Take a warm bath to relieve any burning or pain from frequent diarrhea episodes. Keep all follow-up visits as told by your health care provider. This is important.  Contact a health care provider if: You have a fever. Your diarrhea gets worse. You have new symptoms. You cannot keep fluids down. You feel light-headed or dizzy. You have a headache. You have muscle cramps. Get help right away if: You have chest pain. You feel extremely weak or you faint. You have bloody or black stools or stools that look like tar. You have severe pain, cramping, or bloating in your abdomen. You have trouble breathing or you are breathing very quickly. Your heart is beating very quickly. Your skin feels cold and clammy. You feel confused. You have signs of dehydration, such as: Dark urine, very little urine, or no urine. Cracked lips. Dry mouth. Sunken eyes. Sleepiness. Weakness. Summary Diarrhea is frequent loose and watery bowel movements. Diarrhea can make you feel weak and cause you to become dehydrated. Drink enough fluids to keep your urine pale yellow. Make sure that you wash your hands after using the toilet. If soap and water are not available, use hand sanitizer. Contact a health care provider if your diarrhea gets worse or you have new symptoms. Get help right away if you have signs of dehydration. This information is not intended to replace advice given to you by your health care provider. Make sure you discuss any questions you have with your healthcare provider. Document Revised: 11/21/2018 Document Reviewed: 12/09/2017 Elsevier Patient Education  2022 [employer].   Take Mady Gemma or  like probiotic daily to see if this helps with the diarrhea.

## 2021-02-11 LAB — BMP8+EGFR
BUN/Creatinine Ratio: 6 — ABNORMAL LOW (ref 9–23)
BUN: 4 mg/dL — ABNORMAL LOW (ref 6–24)
CO2: 20 mmol/L (ref 20–29)
Calcium: 8.9 mg/dL (ref 8.7–10.2)
Chloride: 102 mmol/L (ref 96–106)
Creatinine, Ser: 0.68 mg/dL (ref 0.57–1.00)
Glucose: 100 mg/dL — ABNORMAL HIGH (ref 65–99)
Potassium: 3.7 mmol/L (ref 3.5–5.2)
Sodium: 137 mmol/L (ref 134–144)
eGFR: 107 mL/min/{1.73_m2} (ref >59)

## 2021-02-11 LAB — CBC
Hematocrit: 34.9 % (ref 34.0–46.6)
Hemoglobin: 11.6 g/dL (ref 11.1–15.9)
MCH: 31.4 pg (ref 26.6–33.0)
MCHC: 33.2 g/dL (ref 31.5–35.7)
MCV: 94 fL (ref 79–97)
Platelets: 390 10*3/uL (ref 150–450)
RBC: 3.7 x10E6/uL — ABNORMAL LOW (ref 3.77–5.28)
RDW: 12.6 % (ref 11.7–15.4)
WBC: 8 10*3/uL (ref 3.4–10.8)

## 2021-02-11 LAB — NOVEL CORONAVIRUS, NAA: SARS-CoV-2, NAA: NOT DETECTED

## 2021-02-11 LAB — SARS-COV-2, NAA 2 DAY TAT

## 2021-02-12 LAB — CLOSTRIDIUM DIFFICILE EIA: C difficile Toxins A+B, EIA: NEGATIVE

## 2021-02-15 LAB — STOOL CULTURE: E coli, Shiga toxin Assay: NEGATIVE

## 2021-02-16 ENCOUNTER — Encounter: Payer: Self-pay | Admitting: Nurse Practitioner

## 2021-02-20 LAB — OVA AND PARASITE EXAMINATION

## 2021-03-10 ENCOUNTER — Emergency Department (HOSPITAL_COMMUNITY): Payer: No Typology Code available for payment source

## 2021-03-10 ENCOUNTER — Emergency Department (HOSPITAL_COMMUNITY)
Admission: EM | Admit: 2021-03-10 | Discharge: 2021-03-11 | Disposition: A | Discharge status: Home / Self Care | Payer: No Typology Code available for payment source | Attending: Emergency Medicine | Admitting: Emergency Medicine

## 2021-03-10 ENCOUNTER — Encounter (HOSPITAL_COMMUNITY): Payer: Self-pay

## 2021-03-10 ENCOUNTER — Other Ambulatory Visit: Payer: Self-pay

## 2021-03-10 DIAGNOSIS — K5792 Diverticulitis of intestine, part unspecified, without perforation or abscess without bleeding: Secondary | ICD-10-CM

## 2021-03-10 DIAGNOSIS — D72829 Elevated white blood cell count, unspecified: Secondary | ICD-10-CM | POA: Diagnosis not present

## 2021-03-10 DIAGNOSIS — R1032 Left lower quadrant pain: Secondary | ICD-10-CM | POA: Diagnosis not present

## 2021-03-10 DIAGNOSIS — R197 Diarrhea, unspecified: Secondary | ICD-10-CM | POA: Diagnosis not present

## 2021-03-10 DIAGNOSIS — K219 Gastro-esophageal reflux disease without esophagitis: Secondary | ICD-10-CM | POA: Insufficient documentation

## 2021-03-10 DIAGNOSIS — R1013 Epigastric pain: Secondary | ICD-10-CM | POA: Diagnosis not present

## 2021-03-10 LAB — CBC WITH DIFFERENTIAL/PLATELET
Abs Immature Granulocytes: 0.06 10*3/uL (ref 0.00–0.07)
Basophils Absolute: 0 10*3/uL (ref 0.0–0.1)
Basophils Relative: 0 %
Eosinophils Absolute: 0.2 10*3/uL (ref 0.0–0.5)
Eosinophils Relative: 2 %
HCT: 40.1 % (ref 36.0–46.0)
Hemoglobin: 13 g/dL (ref 12.0–15.0)
Immature Granulocytes: 1 %
Lymphocytes Relative: 13 %
Lymphs Abs: 1.5 10*3/uL (ref 0.7–4.0)
MCH: 31.6 pg (ref 26.0–34.0)
MCHC: 32.4 g/dL (ref 30.0–36.0)
MCV: 97.3 fL (ref 80.0–100.0)
Monocytes Absolute: 0.9 10*3/uL (ref 0.1–1.0)
Monocytes Relative: 8 %
Neutro Abs: 9.1 10*3/uL — ABNORMAL HIGH (ref 1.7–7.7)
Neutrophils Relative %: 76 %
Platelets: 383 10*3/uL (ref 150–400)
RBC: 4.12 MIL/uL (ref 3.87–5.11)
RDW: 13.8 % (ref 11.5–15.5)
WBC: 11.9 10*3/uL — ABNORMAL HIGH (ref 4.0–10.5)
nRBC: 0 % (ref 0.0–0.2)

## 2021-03-10 LAB — COMPREHENSIVE METABOLIC PANEL
ALT: 21 U/L (ref 0–44)
AST: 19 U/L (ref 15–41)
Albumin: 3.9 g/dL (ref 3.5–5.0)
Alkaline Phosphatase: 63 U/L (ref 38–126)
Anion gap: 8 (ref 5–15)
BUN: 6 mg/dL (ref 6–20)
CO2: 25 mmol/L (ref 22–32)
Calcium: 9.7 mg/dL (ref 8.9–10.3)
Chloride: 103 mmol/L (ref 98–111)
Creatinine, Ser: 0.58 mg/dL (ref 0.44–1.00)
GFR, Estimated: >60 mL/min (ref >60)
Glucose, Bld: 109 mg/dL — ABNORMAL HIGH (ref 70–99)
Potassium: 4.2 mmol/L (ref 3.5–5.1)
Sodium: 136 mmol/L (ref 135–145)
Total Bilirubin: 0.7 mg/dL (ref 0.3–1.2)
Total Protein: 9.3 g/dL — ABNORMAL HIGH (ref 6.5–8.1)

## 2021-03-10 LAB — LIPASE, BLOOD: Lipase: 24 U/L (ref 11–51)

## 2021-03-10 LAB — I-STAT BETA HCG BLOOD, ED (MC, WL, AP ONLY): I-stat hCG, quantitative: <5 m[IU]/mL (ref <5)

## 2021-03-10 MED ORDER — IOHEXOL 350 MG/ML SOLN
80.0000 mL | Freq: Once | INTRAVENOUS | Status: AC | PRN
  Administered 2021-03-10: 80 mL via INTRAVENOUS

## 2021-03-10 NOTE — ED Provider Notes (Signed)
[facility] EMERGENCY DEPARTMENT Provider Note  CSN: [id number] Arrival date & time: 03/10/21 1750    History Chief Complaint  Patient presents with  . Abdominal Pain    [name] is a 50 y.o. female presents for evaluation of abdominal pain, ongoing for the last 2 days. She has had intermittent constipation and diarrhea over the last several weeks. Saw PCP 7/26 during a period of frequency diarrhea and had labs including Cdiff, O&P that were neg. She reports she has not been constipated for the last 4 days, having pain in the epigastric and LLQ areas. Pain is improved after urination. No dysuria or hematuria. She has had cholecystectomy. No fever, no vomiting.    Past Medical History:  Diagnosis Date  . GERD (gastroesophageal reflux disease)   . Hyperlipidemia   . Iron deficiency anemia   . Obesity     Past Surgical History:  Procedure Laterality Date  . CHOLECYSTECTOMY    . TUBAL LIGATION      Family History  Problem Relation Age of Onset  . Colon cancer Mother   . Multiple sclerosis Father   . Diabetes Father   . Hypertension Father   . Sarcoidosis Sister   . Diabetes Sister   . Hypertension Sister   . Healthy Son   . Breast cancer Neg Hx     Social History   Tobacco Use  . Smoking status: Never  . Smokeless tobacco: Never  Vaping Use  . Vaping Use: Never used  Substance Use Topics  . Alcohol use: No  . Drug use: No     Home Medications Prior to Admission medications   Medication Sig Start Date End Date Taking? Authorizing Provider  ferrous sulfate 325 (65 FE) MG EC tablet Take 1 tablet (325 mg total) by mouth 2 (two) times daily. 04/27/20 04/27/21  Arnette Felts, FNP  omeprazole (PRILOSEC) 20 MG capsule Take 20 mg by mouth daily. Patient not taking: Reported on 02/10/2021    [provider]  phentermine 37.5 MG capsule 1/2 to 1 30 minutes before breakfast Patient not taking: No sig reported 08/23/19   Rodriguez-Southworth, Nettie Elm,  PA-C     Allergies    Patient has no known allergies.   Review of Systems   Review of Systems A comprehensive review of systems was completed and negative except as noted in HPI.    Physical Exam BP 123/86   Pulse 80   Temp 99.3 F (37.4 C) (Oral)   Resp 19   LMP  (LMP Unknown) Comment: neg preg test  SpO2 100%   Physical Exam Vitals and nursing note reviewed.  Constitutional:      Appearance: Normal appearance.  HENT:     Head: Normocephalic and atraumatic.     Nose: Nose normal.     Mouth/Throat:     Mouth: Mucous membranes are moist.  Eyes:     Extraocular Movements: Extraocular movements intact.     Conjunctiva/sclera: Conjunctivae normal.  Cardiovascular:     Rate and Rhythm: Normal rate.  Pulmonary:     Effort: Pulmonary effort is normal.     Breath sounds: Normal breath sounds.  Abdominal:     General: Abdomen is flat.     Palpations: Abdomen is soft.     Tenderness: There is abdominal tenderness in the left lower quadrant. There is no guarding. Negative signs include Murphy's sign and McBurney's sign.  Musculoskeletal:        General: No swelling. Normal range  of motion.     Cervical back: Neck supple.  Skin:    General: Skin is warm and dry.  Neurological:     General: No focal deficit present.     Mental Status: She is alert.  Psychiatric:        Mood and Affect: Mood normal.     ED Results / Procedures / Treatments   Labs (all labs ordered are listed, but only abnormal results are displayed) Labs Reviewed  COMPREHENSIVE METABOLIC PANEL - Abnormal; Notable for the following components:      Result Value   Glucose, Bld 109 (*)    Total Protein 9.3 (*)    All other components within normal limits  CBC WITH DIFFERENTIAL/PLATELET - Abnormal; Notable for the following components:   WBC 11.9 (*)    Neutro Abs 9.1 (*)    All other components within normal limits  LIPASE, BLOOD  URINALYSIS, ROUTINE W REFLEX MICROSCOPIC  I-STAT BETA HCG BLOOD,  ED ([facility], [facility], [facility] ONLY)    EKG None  Radiology DG Abd Acute W/Chest  Result Date: 03/10/2021 CLINICAL DATA:  Diarrhea off and on in the constipation off and on for 1 month. Mid abdominal pain also EXAM: DG ABDOMEN ACUTE WITH 1 VIEW CHEST COMPARISON:  None. FINDINGS: Normal heart size and pulmonary vascularity. No focal airspace disease or consolidation in the lungs. No blunting of costophrenic angles. No pneumothorax. Mediastinal contours appear intact. Scattered gas and stool throughout the colon. No small or large bowel distention. No free intra-abdominal air. No abnormal air-fluid levels. No radiopaque stones. Surgical clips in the right upper quadrant. Suggestion of some amorphous calcification in the right lower quadrant. This is nonspecific but could indicate appendicoliths. Correlation with physical examination recommended. IMPRESSION: 1. No evidence of active pulmonary disease. 2. Normal nonobstructive bowel gas pattern. 3. Nonspecific calcifications in the right lower quadrant could possibly indicate appendicoliths. Electronically Signed   By: Burman Nieves M.D.   On: 03/10/2021 21:20    Procedures Procedures  Medications Ordered in the ED Medications - No data to display   MDM Rules/Calculators/A&P MDM Patient's labs in triage show mild leukocytosis otherwise unremarkable. AAS was neg. Will send for CT to eval diverticulitis.   ED Course  I have reviewed the triage vital signs and the nursing notes.  Pertinent labs & imaging results that were available during my care of the patient were reviewed by me and considered in my medical decision making (see chart for details).  Clinical Course as of 03/10/21 2311  Tue Mar 10, 2021  2308 Care of the patient signed out to Dr. Pilar Plate and the change of shift pending CT.  [CS]    Clinical Course User Index [CS] Pollyann Savoy, MD    Final Clinical Impression(s) / ED Diagnoses Final diagnoses:  None    Rx / DC Orders ED  Discharge Orders     None        Pollyann Savoy, MD 03/10/21 2311

## 2021-03-10 NOTE — ED Triage Notes (Signed)
Pt reports having diarrhea and constipation over the past month. Pt's last bowel movement was Saturday (runny). Pt's complaint today is constipation and abdominal pain.

## 2021-03-10 NOTE — ED Provider Notes (Signed)
Emergency Medicine Provider Triage Evaluation Note  [name] , a 50 y.o. female  was evaluated in triage.  Pt complains of abdominal pain.  Patient reports that for the past month she has been having intermittent constipation and diarrhea and it seems to constantly flip between the 2 and she has not had a normal bowel movement.  She is currently constipated and has not been able to have bowel movement since Saturday when she had a large episode of diarrhea.  No blood in her stool.  Since that her stay she has had worsening abdominal pains present diffusely, she reports currently pain is worse across her lower abdomen.  No nausea or vomiting but she has had no appetite today.  Reports that she was able to pass a very small amount of gas this morning but has not been able to pass gas throughout the rest of the day.  Prior cholecystectomy, no other abdominal surgeries, no history of bowel obstructions  Review of Systems  Positive: Abdominal pain, constipation, diarrhea Negative: Fever, vomiting, dysuria, blood in stool  Physical Exam  BP 125/90 (BP Location: Left Arm)   Pulse (!) 101   Temp 99.3 F (37.4 C) (Oral)   Resp 16   LMP  (LMP Unknown)   SpO2 100%  Gen:   Awake, no distress   Resp:  Normal effort  MSK:   Moves extremities without difficulty  Other:  Abdomen soft and nondistended, tenderness across the lower abdomen and some mild left upper quadrant tenderness, no guarding or peritoneal signs  Medical Decision Making  Medically screening exam initiated at 7:59 PM.  Appropriate orders placed.  [name] was informed that the remainder of the evaluation will be completed by another provider, this initial triage assessment does not replace that evaluation, and the importance of remaining in the ED until their evaluation is complete.     Legrand Rams 03/10/21 2007    Pollyann Savoy, MD 03/10/21 [phone]

## 2021-03-10 NOTE — ED Provider Notes (Signed)
  Provider Note MRN:  [id number]  Arrival date & time: 03/11/21    ED Course and Medical Decision Making  Assumed care from Dr. Bernette Mayers at shift change.  Left lower quadrant pain awaiting CT abdomen.  CT confirms uncomplicated diverticulitis.  On my reassessment patient is looking and feeling well, normal vital signs, appropriate for outpatient management.  Procedures  Final Clinical Impressions(s) / ED Diagnoses     ICD-10-CM   1. Diverticulitis  K57.92       ED Discharge Orders          Ordered    amoxicillin-clavulanate (AUGMENTIN) 875-125 MG tablet  Every 12 hours        03/11/21 0124              Discharge Instructions      You were evaluated in the Emergency Department and after careful evaluation, we did not find any emergent condition requiring admission or further testing in the hospital.  Your exam/testing today was overall reassuring.  Symptoms seem to be due to diverticulitis.  Please take the antibiotics as directed.  Use Tylenol or Motrin for pain.  Please return to the Emergency Department if you experience any worsening of your condition.  Thank you for allowing [place] to be a part of your care.       Elmer Sow. Pilar Plate, MD [facility] Emergency Medicine [facility] [email or web address]    Sabas Sous, MD 03/11/21 [phone]

## 2021-03-11 ENCOUNTER — Ambulatory Visit (INDEPENDENT_AMBULATORY_CARE_PROVIDER_SITE_OTHER): Payer: No Typology Code available for payment source | Admitting: Nurse Practitioner

## 2021-03-11 ENCOUNTER — Encounter: Payer: Self-pay | Admitting: Nurse Practitioner

## 2021-03-11 ENCOUNTER — Other Ambulatory Visit (HOSPITAL_COMMUNITY): Payer: Self-pay

## 2021-03-11 VITALS — BP 108/64 | HR 88 | Temp 98.4°F | Ht 60.0 in | Wt 189.8 lb

## 2021-03-11 DIAGNOSIS — K5792 Diverticulitis of intestine, part unspecified, without perforation or abscess without bleeding: Secondary | ICD-10-CM

## 2021-03-11 DIAGNOSIS — D509 Iron deficiency anemia, unspecified: Secondary | ICD-10-CM | POA: Diagnosis not present

## 2021-03-11 DIAGNOSIS — K5900 Constipation, unspecified: Secondary | ICD-10-CM

## 2021-03-11 MED ORDER — AMOXICILLIN-POT CLAVULANATE 875-125 MG PO TABS
1.0000 | ORAL_TABLET | Freq: Two times a day (BID) | ORAL | 0 refills | Status: AC
  Filled 2021-03-11: qty 14, 7d supply, fill #0

## 2021-03-11 MED ORDER — AMOXICILLIN-POT CLAVULANATE 875-125 MG PO TABS
1.0000 | ORAL_TABLET | Freq: Once | ORAL | Status: AC
  Administered 2021-03-11: 1 via ORAL
  Filled 2021-03-11: qty 1

## 2021-03-11 MED ORDER — IBUPROFEN 800 MG PO TABS
800.0000 mg | ORAL_TABLET | Freq: Once | ORAL | Status: AC
  Administered 2021-03-11: 800 mg via ORAL
  Filled 2021-03-11: qty 1

## 2021-03-11 NOTE — Discharge Instructions (Addendum)
You were evaluated in the Emergency Department and after careful evaluation, we did not find any emergent condition requiring admission or further testing in the hospital.  Your exam/testing today was overall reassuring.  Symptoms seem to be due to diverticulitis.  Please take the antibiotics as directed.  Use Tylenol or Motrin for pain.  Please return to the Emergency Department if you experience any worsening of your condition.  Thank you for allowing [place] to be a part of your care.

## 2021-03-11 NOTE — Progress Notes (Signed)
I,Katawbba Wiggins,acting as a Neurosurgeon for [employer], FNP.,have documented all relevant documentation on the behalf of Arnette Felts, FNP,as directed by  Arnette Felts, FNP while in the presence of Arnette Felts, FNP.  This visit occurred during the SARS-CoV-2 public health emergency.  Safety protocols were in place, including screening questions prior to the visit, additional usage of staff PPE, and extensive cleaning of exam room while observing appropriate contact time as indicated for disinfecting solutions.  Subjective:     Patient ID: [name] , female    DOB: 1971-01-13 , 50 y.o.   MRN: [id number]   Chief Complaint  Patient presents with   Follow-up    HPI  The patient is here today for a hospital f/u.  The patient was diagnosed with diverticulitis.  She began having more pain yesterday and went to the ER. She was unable to go to work today. She has been taking motrin or tylenol for the pain which is better. She     Past Medical History:  Diagnosis Date   GERD (gastroesophageal reflux disease)    Hyperlipidemia    Iron deficiency anemia    Obesity      Family History  Problem Relation Age of Onset   Colon cancer Mother    Multiple sclerosis Father    Diabetes Father    Hypertension Father    Sarcoidosis Sister    Diabetes Sister    Hypertension Sister    Healthy Son    Breast cancer Neg Hx      Current Outpatient Medications:    amoxicillin-clavulanate (AUGMENTIN) 875-125 MG tablet, Take 1 tablet by mouth every 12 (twelve) hours for 7 days., Disp: 14 tablet, Rfl: 0   ferrous sulfate 325 (65 FE) MG EC tablet, Take 1 tablet (325 mg total) by mouth 2 (two) times daily., Disp: 60 tablet, Rfl: 3   Lactobacillus-Inulin (CULTURELLE DIGESTIVE HEALTH) CAPS, Take 1 capsule by mouth daily., Disp: , Rfl:    loperamide (IMODIUM) 2 MG capsule, Take 2 mg by mouth as needed for diarrhea or loose stools., Disp: , Rfl:    No Known Allergies   Review of Systems   Constitutional: Negative.  Negative for fatigue.  Respiratory: Negative.  Negative for cough and wheezing.   Cardiovascular: Negative.  Negative for chest pain, palpitations and leg swelling.  Gastrointestinal:  Positive for constipation. Negative for abdominal distention, diarrhea, nausea and vomiting.  Neurological:  Negative for dizziness and headaches.  Psychiatric/Behavioral: Negative.      Today's Vitals   03/11/21 1607  BP: 108/64  Pulse: 88  Temp: 98.4 F (36.9 C)  TempSrc: Oral  Weight: 189 lb 12.8 oz (86.1 kg)  Height: 5' (1.524 m)   Body mass index is 37.07 kg/m.  Wt Readings from Last 3 Encounters:  03/11/21 189 lb 12.8 oz (86.1 kg)  02/10/21 196 lb 12.8 oz (89.3 kg)  06/30/20 201 lb 6.4 oz (91.4 kg)    BP Readings from Last 3 Encounters:  03/11/21 108/64  03/11/21 111/64  02/10/21 118/80    Objective:  Physical Exam Vitals reviewed.  Constitutional:      General: She is not in acute distress.    Appearance: Normal appearance.  Cardiovascular:     Rate and Rhythm: Normal rate and regular rhythm.     Pulses: Normal pulses.     Heart sounds: Normal heart sounds. No murmur heard. Pulmonary:     Effort: Pulmonary effort is normal. No respiratory distress.  Breath sounds: Normal breath sounds. No wheezing.  Abdominal:     General: Abdomen is flat. Bowel sounds are normal. There is no distension.     Palpations: Abdomen is soft. There is no mass.     Tenderness: There is no abdominal tenderness (left lower abdomen tenderness).  Skin:    General: Skin is warm and dry.     Capillary Refill: Capillary refill takes less than 2 seconds.     Coloration: Skin is not jaundiced.     Findings: No bruising.  Neurological:     General: No focal deficit present.     Mental Status: She is alert and oriented to person, place, and time.     Cranial Nerves: No cranial nerve deficit.     Motor: No weakness.  Psychiatric:        Mood and Affect: Mood normal.         Behavior: Behavior normal.        Thought Content: Thought content normal.        Judgment: Judgment normal.        Assessment And Plan:     1. Constipation, unspecified constipation type Comments: This may be related to her iron supplement, increase water intake and fiber intake A high fiber diet with plenty of fluids (up to 8 glasses of water daily) is suggested to relieve these symptoms.  Metamucil, 1 tablespoon once or twice daily can be used to keep bowels regular if needed.   2. Diverticulitis Comments: New diagnosis as of yesterday in the ER, taking Augmentin only had 2 doses Education on Diverticulitis provided  3. Iron deficiency anemia, unspecified iron deficiency anemia type Comments: Will check levels if improved will have her to take 3 days a week - Iron, TIBC and Ferritin Panel    Patient was given opportunity to ask questions. Patient verbalized understanding of the plan and was able to repeat key elements of the plan. All questions were answered to their satisfaction.  Arnette Felts, FNP   I, Arnette Felts, FNP, have reviewed all documentation for this visit. The documentation on 03/11/21 for the exam, diagnosis, procedures, and orders are all accurate and complete.   IF YOU HAVE BEEN REFERRED TO A SPECIALIST, IT MAY TAKE 1-2 WEEKS TO SCHEDULE/PROCESS THE REFERRAL. IF YOU HAVE NOT HEARD FROM US/SPECIALIST IN TWO WEEKS, PLEASE GIVE [place] A CALL AT [phone].   THE PATIENT IS ENCOURAGED TO PRACTICE SOCIAL DISTANCING DUE TO THE COVID-19 PANDEMIC.

## 2021-03-11 NOTE — Patient Instructions (Signed)
Diverticulitis Diverticulitis is infection or inflammation of small pouches (diverticula) in the colon that form due to a condition called diverticulosis. Diverticula can trap stool (feces) and bacteria, causing infection and inflammation. Diverticulitis may cause severe stomach pain and diarrhea. It may lead to tissue damage in the colon that causes bleeding or blockage. The diverticula may also burst (rupture) and cause infected stool to enter other areas of the abdomen. What are the causes? This condition is caused by stool becoming trapped in the diverticula, which allows bacteria to grow in the diverticula. This leads to inflammation and infection. What increases the risk? You are more likely to develop this condition if you have diverticulosis. The risk increases if you: Are overweight or obese. Do not get enough exercise. Drink alcohol. Use tobacco products. Eat a diet that has a lot of red meat such as beef, pork, or lamb. Eat a diet that does not include enough fiber. High-fiber foods include fruits, vegetables, beans, nuts, and whole grains. Are over 40 years of age. What are the signs or symptoms? Symptoms of this condition may include: Pain and tenderness in the abdomen. The pain is normally located on the left side of the abdomen, but it may occur in other areas. Fever and chills. Nausea. Vomiting. Cramping. Bloating. Changes in bowel routines. Blood in your stool. How is this diagnosed? This condition is diagnosed based on: Your medical history. A physical exam. Tests to make sure there is nothing else causing your condition. These tests may include: Blood tests. Urine tests. CT scan of the abdomen. How is this treated? Most cases of this condition are mild and can be treated at home. Treatment may include: Taking over-the-counter pain medicines. Following a clear liquid diet. Taking antibiotic medicines by mouth. Resting. More severe cases may need to be treated  at a hospital. Treatment may include: Not eating or drinking. Taking prescription pain medicine. Receiving antibiotic medicines through an IV. Receiving fluids and nutrition through an IV. Surgery. When your condition is under control, your health care provider may recommend that you have a colonoscopy. This is an exam to look at the entire large intestine. During the exam, a lubricated, bendable tube is inserted into the anus and then passed into the rectum, colon, and other parts of the large intestine. A colonoscopy can show how severe your diverticula are and whether something else may be causing your symptoms. Follow these instructions at home: Medicines Take over-the-counter and prescription medicines only as told by your health care provider. These include fiber supplements, probiotics, and stool softeners. If you were prescribed an antibiotic medicine, take it as told by your health care provider. Do not stop taking the antibiotic even if you start to feel better. Ask your health care provider if the medicine prescribed to you requires you to avoid driving or using machinery. Eating and drinking  Follow a full liquid diet or another diet as directed by your health care provider. After your symptoms improve, your health care provider may tell you to change your diet. He or she may recommend that you eat a diet that contains at least 25 grams (25 g) of fiber daily. Fiber makes it easier to pass stool. Healthy sources of fiber include: Berries. One cup contains 4-8 grams of fiber. Beans or lentils. One-half cup contains 5-8 grams of fiber. Green vegetables. One cup contains 4 grams of fiber. Avoid eating red meat. General instructions Do not use any products that contain nicotine or tobacco, such as cigarettes,   e-cigarettes, and chewing tobacco. If you need help quitting, ask your health care provider. Exercise for at least 30 minutes, 3 times each week. You should exercise hard enough to  raise your heart rate and break a sweat. Keep all follow-up visits as told by your health care provider. This is important. You may need to have a colonoscopy. Contact a health care provider if: Your pain does not improve. Your bowel movements do not return to normal. Get help right away if: Your pain gets worse. Your symptoms do not get better with treatment. Your symptoms suddenly get worse. You have a fever. You vomit more than one time. You have stools that are bloody, black, or tarry. Summary Diverticulitis is infection or inflammation of small pouches (diverticula) in the colon that form due to a condition called diverticulosis. Diverticula can trap stool (feces) and bacteria, causing infection and inflammation. You are at higher risk for this condition if you have diverticulosis and you eat a diet that does not include enough fiber. Most cases of this condition are mild and can be treated at home. More severe cases may need to be treated at a hospital. When your condition is under control, your health care provider may recommend that you have an exam called a colonoscopy. This exam can show how severe your diverticula are and whether something else may be causing your symptoms. Keep all follow-up visits as told by your health care provider. This is important. This information is not intended to replace advice given to you by your health care provider. Make sure you discuss any questions you have with your health care provider. Document Revised: 04/16/2019 Document Reviewed: 04/16/2019 Elsevier Patient Education  2022 Elsevier Inc.  

## 2021-03-11 NOTE — ED Notes (Signed)
Patient requesting tylenol or ibuprofen prior to discharge, MD notified.

## 2021-03-12 ENCOUNTER — Encounter: Payer: Self-pay | Admitting: Nurse Practitioner

## 2021-03-12 LAB — IRON,TIBC AND FERRITIN PANEL
Ferritin: 186 ng/mL — ABNORMAL HIGH (ref 15–150)
Iron Saturation: 8 % — CL (ref 15–55)
Iron: 17 ug/dL — ABNORMAL LOW (ref 27–159)
Total Iron Binding Capacity: 216 ug/dL — ABNORMAL LOW (ref 250–450)
UIBC: 199 ug/dL (ref 131–425)

## 2021-03-13 ENCOUNTER — Encounter: Payer: Self-pay | Admitting: Nurse Practitioner

## 2021-04-04 IMAGING — MG DIGITAL DIAGNOSTIC BILAT W/ TOMO W/ CAD
6 of 10 series · 6 of 30 positions shown · non-contrast
Comparison: Previous exam(s).

CLINICAL DATA: Multiple hard nodules felt in both breasts on recent
physical examination most pronounced in the 2 o'clock position of
the right breast and 4 o'clock position of the left breast.

EXAM:
DIGITAL DIAGNOSTIC BILATERAL MAMMOGRAM WITH CAD AND TOMO
ULTRASOUND BILATERAL BREAST

[R MLO synth-2D (1 of 2)]
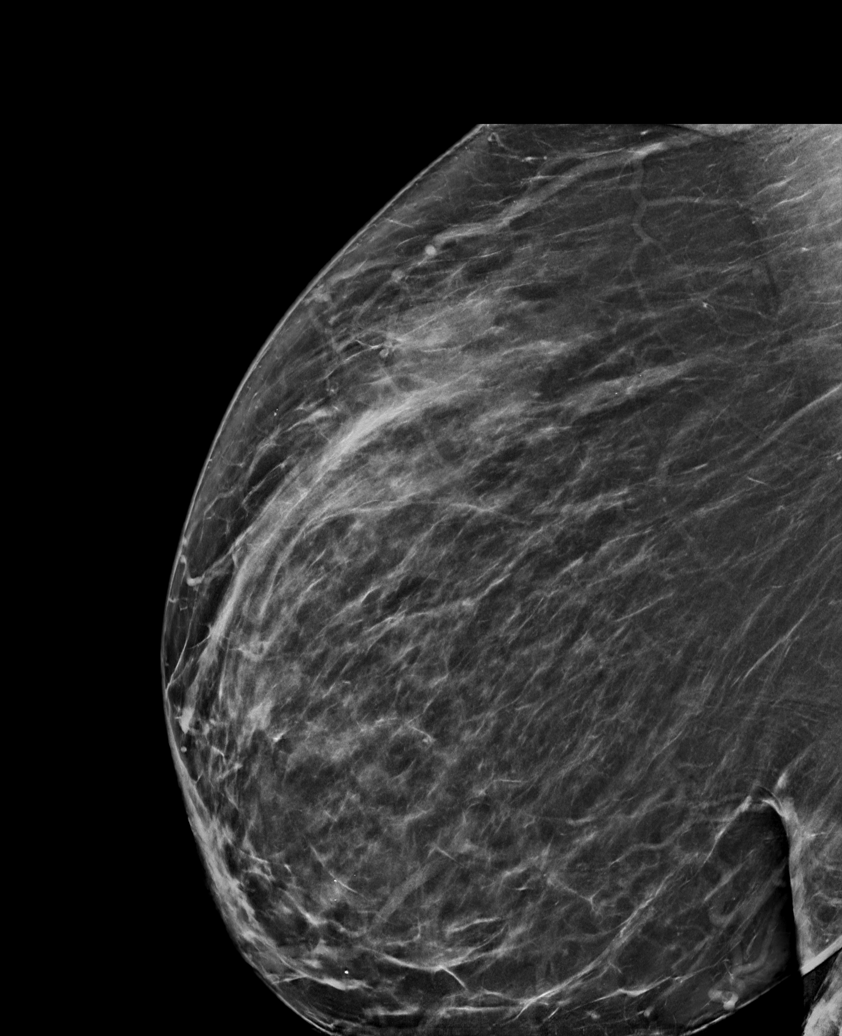

[R CC synth-2D]
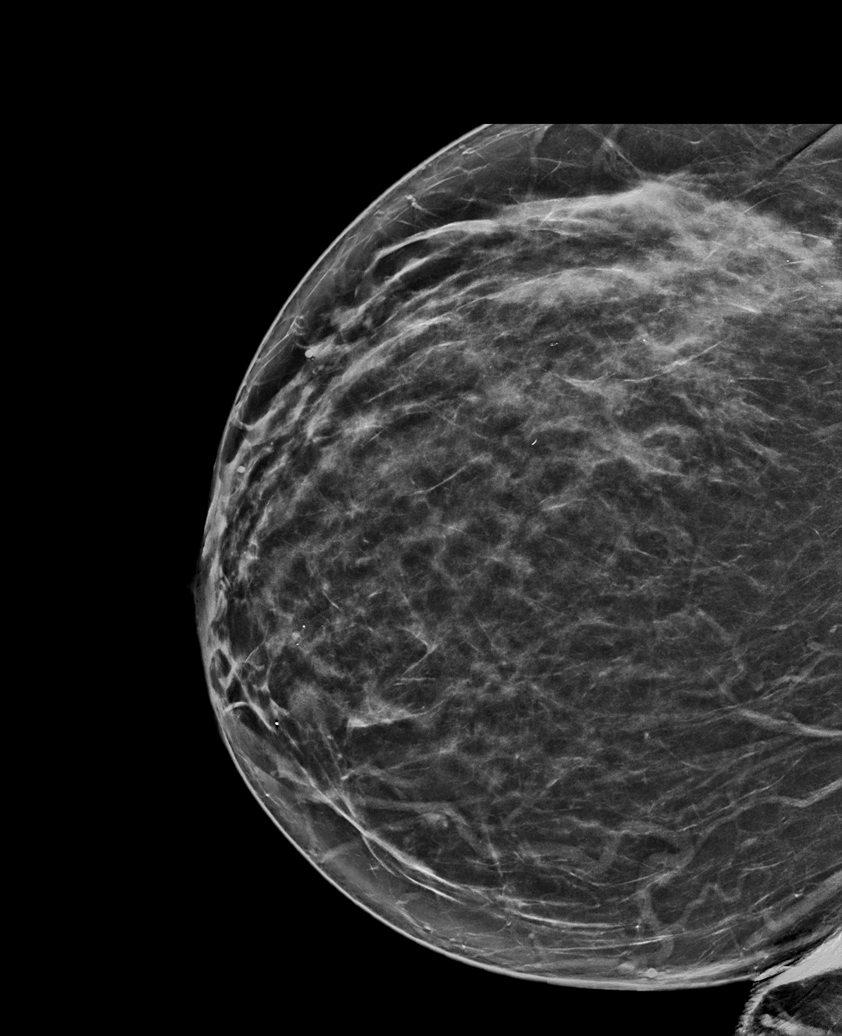

[L MLO synth-2D]
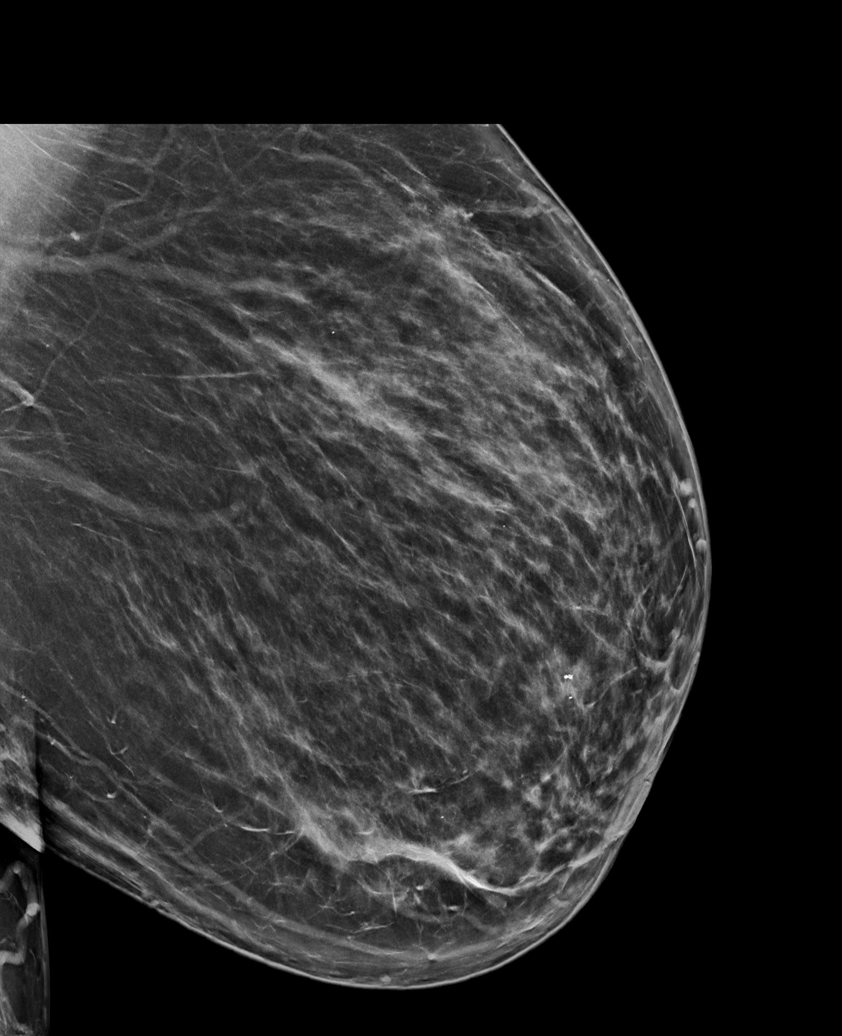

[R MLO synth-2D (2 of 2)]
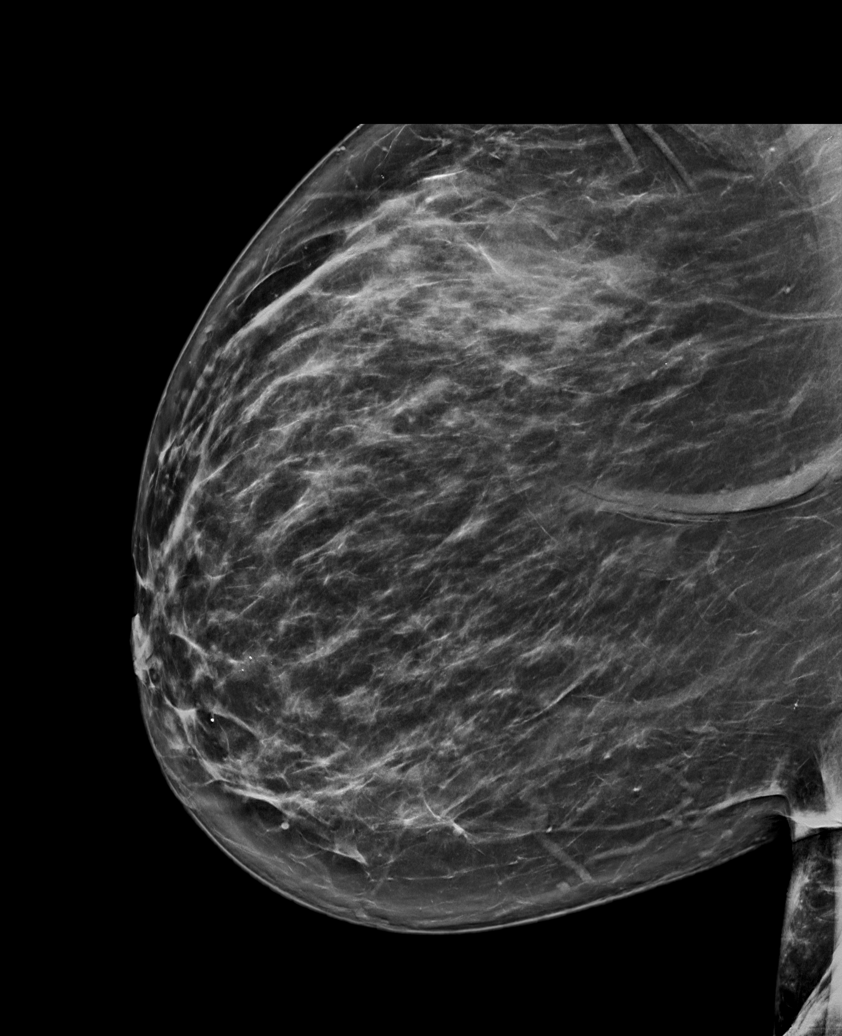

[L CC synth-2D]
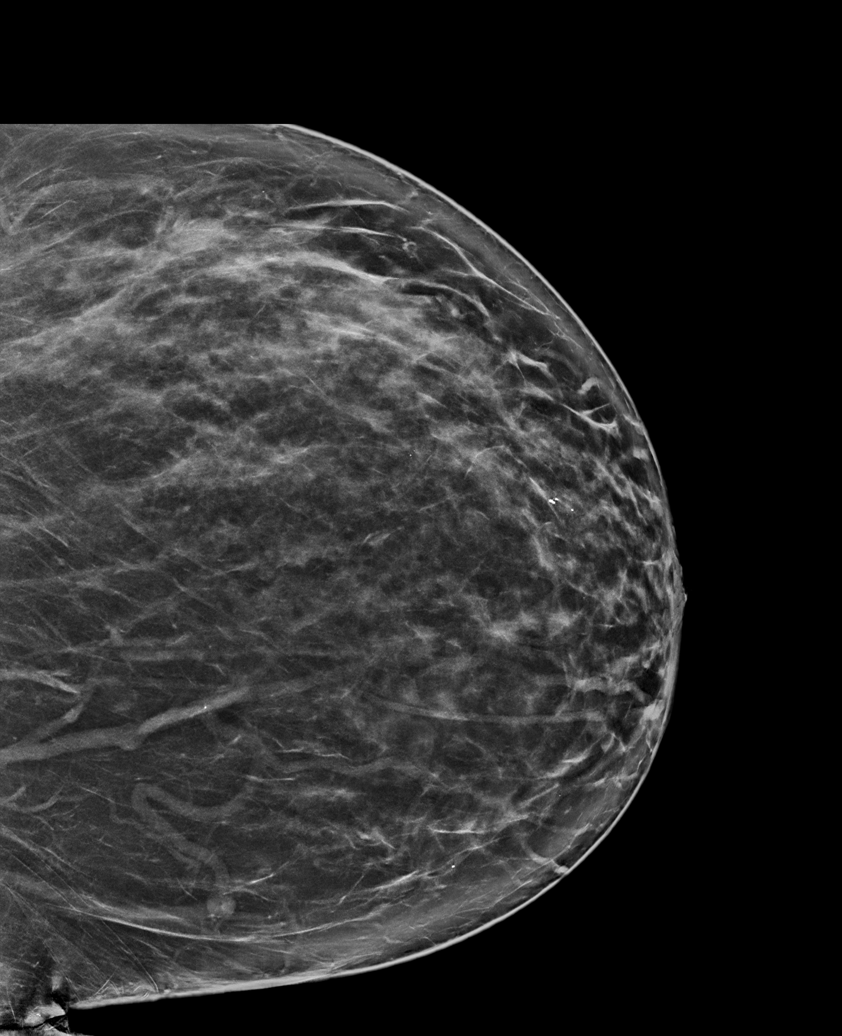

[L MLO tomo · tomo slice 51/100.0]
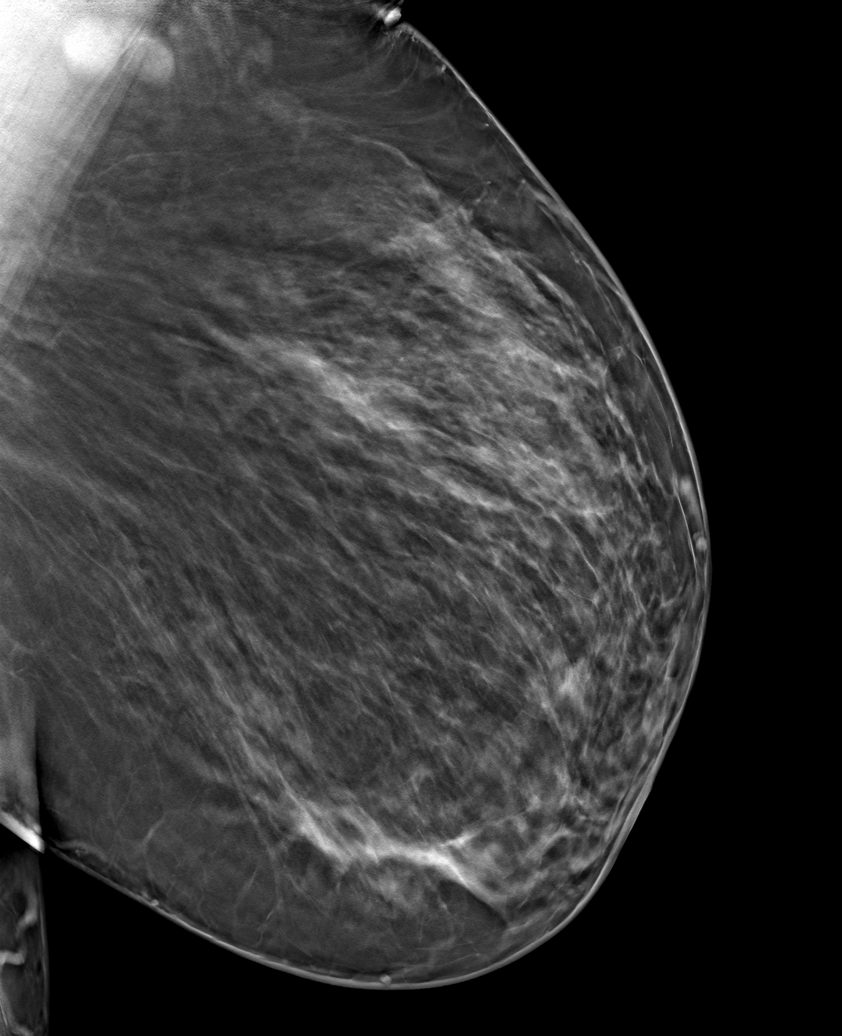

[6 of 30 positions shown; findings below may reference images not displayed]

ACR Breast Density Category c: The breast tissue is heterogeneously
dense, which may obscure small masses.
FINDINGS: Stable mammographic appearance of the breasts with no interval
findings suspicious for malignancy in either breast.

Mammographic images were processed with CAD.

On physical exam, the patient has multiple small palpable mass-like
areas in both breasts. There is a horizontally oriented ridge of
palpable soft tissue thickening in the 3 o'clock position of the
right breast.

Targeted ultrasound is performed, showing normal appearing breast
tissue throughout the areas of palpable concern bilaterally,
including normal fat lobules corresponding to the areas of concern.
IMPRESSION: No evidence of malignancy.

RECOMMENDATION:
Bilateral screening mammogram in 1 year.

I have discussed the findings and recommendations with the patient.
If applicable, a reminder letter will be sent to the patient
regarding the next appointment.

BI-RADS CATEGORY  1: Negative.

## 2021-04-09 ENCOUNTER — Encounter: Payer: Self-pay | Admitting: Nurse Practitioner

## 2021-04-15 ENCOUNTER — Other Ambulatory Visit: Payer: Self-pay

## 2021-04-15 ENCOUNTER — Encounter: Payer: Self-pay | Admitting: Nurse Practitioner

## 2021-04-15 ENCOUNTER — Ambulatory Visit (INDEPENDENT_AMBULATORY_CARE_PROVIDER_SITE_OTHER): Payer: No Typology Code available for payment source | Admitting: Nurse Practitioner

## 2021-04-15 VITALS — BP 114/70 | HR 82 | Temp 98.2°F | Ht 61.0 in

## 2021-04-15 DIAGNOSIS — R1030 Lower abdominal pain, unspecified: Secondary | ICD-10-CM

## 2021-04-15 NOTE — Progress Notes (Signed)
I,Katawbba Wiggins,acting as a Education administrator for [employer], NP.,have documented all relevant documentation on the behalf of [employer], NP,as directed by  Bary Castilla, NP while in the presence of Bary Castilla, NP.  This visit occurred during the SARS-CoV-2 public health emergency.  Safety protocols were in place, including screening questions prior to the visit, additional usage of staff PPE, and extensive cleaning of exam room while observing appropriate contact time as indicated for disinfecting solutions.  Subjective:     Patient ID: [name] , female    DOB: 03/25/1971 , 50 y.o.   MRN: [id number]   Chief Complaint  Patient presents with   Abdominal Pain    HPI  The pt is here today for abdominal pain, that has been going on for a couple of days now. She recently was in the hospital for diverticulitis. She was given Augmentin there. She feels like she still has the pain in her lower abdomen that does not radiate. No symptoms of N/V or Diarrhea or constipation. No other complaints.   Abdominal Pain The pain is located in the LLQ. The abdominal pain does not radiate. Pertinent negatives include no arthralgias, constipation, diarrhea, fever, headaches, myalgias, nausea or vomiting.    Past Medical History:  Diagnosis Date   GERD (gastroesophageal reflux disease)    Hyperlipidemia    Iron deficiency anemia    Obesity      Family History  Problem Relation Age of Onset   Colon cancer Mother    Multiple sclerosis Father    Diabetes Father    Hypertension Father    Sarcoidosis Sister    Diabetes Sister    Hypertension Sister    Healthy Son    Breast cancer Neg Hx      Current Outpatient Medications:    ferrous sulfate 325 (65 FE) MG EC tablet, Take 1 tablet (325 mg total) by mouth 2 (two) times daily., Disp: 60 tablet, Rfl: 3   Lactobacillus-Inulin ([place]) CAPS, Take 1 capsule by mouth daily., Disp: , Rfl:    loperamide  (IMODIUM) 2 MG capsule, Take 2 mg by mouth as needed for diarrhea or loose stools., Disp: , Rfl:    No Known Allergies   Review of Systems  Constitutional:  Negative for chills and fever.  HENT:  Negative for rhinorrhea and sinus pain.   Respiratory:  Negative for shortness of breath and wheezing.   Gastrointestinal:  Positive for abdominal pain. Negative for constipation, diarrhea, nausea and vomiting.  Musculoskeletal:  Negative for arthralgias and myalgias.  Neurological:  Negative for weakness, numbness and headaches.    Today's Vitals   04/15/21 1425  BP: 114/70  Pulse: 82  Temp: 98.2 F (36.8 C)  Height: 5' 1"  (1.549 m)   Body mass index is 35.86 kg/m.  Wt Readings from Last 3 Encounters:  03/11/21 189 lb 12.8 oz (86.1 kg)  02/10/21 196 lb 12.8 oz (89.3 kg)  06/30/20 201 lb 6.4 oz (91.4 kg)    BP Readings from Last 3 Encounters:  04/15/21 114/70  03/11/21 108/64  03/11/21 111/64    Objective:  Physical Exam Constitutional:      Appearance: She is well-developed. She is obese.  HENT:     Head: Normocephalic and atraumatic.  Pulmonary:     Effort: Pulmonary effort is normal. No respiratory distress.     Breath sounds: Normal breath sounds. No wheezing.  Abdominal:     General: Bowel sounds are normal.  Palpations: Abdomen is soft.     Tenderness: There is abdominal tenderness in the left lower quadrant. There is no right CVA tenderness, left CVA tenderness, guarding or rebound. Negative signs include Murphy's sign, Rovsing's sign, McBurney's sign and psoas sign.  Neurological:     Mental Status: She is alert.        Assessment And Plan:     1. Lower abdominal pain - CMP14+EGFR - CBC with Diff - Lipase - CT Abdomen Pelvis W Contrast; Future - Amylase  -Will check labs and reevaluate for diverticulitis. -Advised patient to stay on a clear liquid diet for the next 48-72 hours. And she can take probiotic. Will add Rx after reviewing labs/CT  -Advised her  if her pain increases to go to the emergency room.   -Patient understood treatment plan and did not have any questions.   The patient was encouraged to call or send a message through [place] for any questions or concerns.   Follow up: if symptoms persist or do not get better.   Side effects and appropriate use of all the medication(s) were discussed with the patient today. Patient advised to use the medication(s) as directed by their healthcare provider. The patient was encouraged to read, review, and understand all associated package inserts and contact our office with any questions or concerns. The patient accepts the risks of the treatment plan and had an opportunity to ask questions.   Patient was given opportunity to ask questions. Patient verbalized understanding of the plan and was able to repeat key elements of the plan. All questions were answered to their satisfaction.  Raman Serge Main, DNP   I, Raman Coline Calkin have reviewed all documentation for this visit. The documentation on 04/15/21 for the exam, diagnosis, procedures, and orders are all accurate and complete.      IF YOU HAVE BEEN REFERRED TO A SPECIALIST, IT MAY TAKE 1-2 WEEKS TO SCHEDULE/PROCESS THE REFERRAL. IF YOU HAVE NOT HEARD FROM US/SPECIALIST IN TWO WEEKS, PLEASE GIVE [place] A CALL AT [phone].   THE PATIENT IS ENCOURAGED TO PRACTICE SOCIAL DISTANCING DUE TO THE COVID-19 PANDEMIC.

## 2021-04-16 ENCOUNTER — Encounter: Payer: No Typology Code available for payment source | Admitting: Nurse Practitioner

## 2021-04-16 LAB — CBC WITH DIFFERENTIAL/PLATELET
Basophils Absolute: 0 10*3/uL (ref 0.0–0.2)
Basos: 1 %
EOS (ABSOLUTE): 0.3 10*3/uL (ref 0.0–0.4)
Eos: 5 %
Hematocrit: 32.7 % — ABNORMAL LOW (ref 34.0–46.6)
Hemoglobin: 10.5 g/dL — ABNORMAL LOW (ref 11.1–15.9)
Immature Grans (Abs): 0 10*3/uL (ref 0.0–0.1)
Immature Granulocytes: 0 %
Lymphocytes Absolute: 2.2 10*3/uL (ref 0.7–3.1)
Lymphs: 37 %
MCH: 31.3 pg (ref 26.6–33.0)
MCHC: 32.1 g/dL (ref 31.5–35.7)
MCV: 97 fL (ref 79–97)
Monocytes Absolute: 0.6 10*3/uL (ref 0.1–0.9)
Monocytes: 11 %
Neutrophils Absolute: 2.8 10*3/uL (ref 1.4–7.0)
Neutrophils: 46 %
Platelets: 375 10*3/uL (ref 150–450)
RBC: 3.36 x10E6/uL — ABNORMAL LOW (ref 3.77–5.28)
RDW: 12.8 % (ref 11.7–15.4)
WBC: 6 10*3/uL (ref 3.4–10.8)

## 2021-04-16 LAB — AMYLASE: Amylase: 60 U/L (ref 31–110)

## 2021-04-16 LAB — CMP14+EGFR
ALT: 37 IU/L — ABNORMAL HIGH (ref 0–32)
AST: 57 IU/L — ABNORMAL HIGH (ref 0–40)
Albumin/Globulin Ratio: 0.9 — ABNORMAL LOW (ref 1.2–2.2)
Albumin: 3.5 g/dL — ABNORMAL LOW (ref 3.8–4.8)
Alkaline Phosphatase: 58 IU/L (ref 44–121)
BUN/Creatinine Ratio: 17 (ref 9–23)
BUN: 8 mg/dL (ref 6–24)
Bilirubin Total: <0.2 mg/dL (ref 0.0–1.2)
CO2: 23 mmol/L (ref 20–29)
Calcium: 8.8 mg/dL (ref 8.7–10.2)
Chloride: 101 mmol/L (ref 96–106)
Creatinine, Ser: 0.47 mg/dL — ABNORMAL LOW (ref 0.57–1.00)
Globulin, Total: 4.1 g/dL (ref 1.5–4.5)
Glucose: 94 mg/dL (ref 70–99)
Potassium: 4.4 mmol/L (ref 3.5–5.2)
Sodium: 138 mmol/L (ref 134–144)
Total Protein: 7.6 g/dL (ref 6.0–8.5)
eGFR: 117 mL/min/{1.73_m2} (ref >59)

## 2021-04-16 LAB — LIPASE: Lipase: 31 U/L (ref 14–72)

## 2021-04-22 ENCOUNTER — Encounter: Payer: No Typology Code available for payment source | Admitting: Nurse Practitioner

## 2021-05-07 ENCOUNTER — Encounter: Payer: No Typology Code available for payment source | Admitting: Nurse Practitioner

## 2021-06-02 ENCOUNTER — Telehealth: Payer: Self-pay

## 2021-06-02 NOTE — Telephone Encounter (Signed)
Lvm. Attempting to reach pt about CT. Pt needs to schedule.

## 2021-10-12 DIAGNOSIS — R197 Diarrhea, unspecified: Secondary | ICD-10-CM | POA: Diagnosis not present

## 2021-10-12 DIAGNOSIS — Z800 Family history of malignant neoplasm of digestive organs: Secondary | ICD-10-CM | POA: Diagnosis not present

## 2021-10-12 DIAGNOSIS — K5732 Diverticulitis of large intestine without perforation or abscess without bleeding: Secondary | ICD-10-CM | POA: Diagnosis not present

## 2021-10-12 DIAGNOSIS — Z1211 Encounter for screening for malignant neoplasm of colon: Secondary | ICD-10-CM | POA: Diagnosis not present

## 2021-11-06 ENCOUNTER — Other Ambulatory Visit (HOSPITAL_COMMUNITY): Payer: Self-pay

## 2021-11-06 MED ORDER — CLENPIQ 10-3.5-12 MG-GM -GM/160ML PO SOLN
160.0000 mL | ORAL | 0 refills | Status: DC
  Filled 2021-11-06: qty 320, 1d supply, fill #0

## 2021-11-09 ENCOUNTER — Other Ambulatory Visit (HOSPITAL_COMMUNITY): Payer: Self-pay

## 2021-11-19 DIAGNOSIS — Z800 Family history of malignant neoplasm of digestive organs: Secondary | ICD-10-CM | POA: Diagnosis not present

## 2021-11-19 DIAGNOSIS — K5730 Diverticulosis of large intestine without perforation or abscess without bleeding: Secondary | ICD-10-CM | POA: Diagnosis not present

## 2021-11-19 DIAGNOSIS — K5140 Inflammatory polyps of colon without complications: Secondary | ICD-10-CM | POA: Diagnosis not present

## 2021-11-19 DIAGNOSIS — K635 Polyp of colon: Secondary | ICD-10-CM | POA: Diagnosis not present

## 2021-11-19 DIAGNOSIS — Z1211 Encounter for screening for malignant neoplasm of colon: Secondary | ICD-10-CM | POA: Diagnosis not present

## 2021-12-24 ENCOUNTER — Ambulatory Visit: Payer: 59 | Admitting: Podiatry

## 2021-12-24 ENCOUNTER — Ambulatory Visit (INDEPENDENT_AMBULATORY_CARE_PROVIDER_SITE_OTHER): Payer: 59

## 2021-12-24 ENCOUNTER — Other Ambulatory Visit (HOSPITAL_COMMUNITY): Payer: Self-pay

## 2021-12-24 ENCOUNTER — Ambulatory Visit
Admission: EM | Admit: 2021-12-24 | Discharge: 2021-12-24 | Disposition: A | Discharge status: Home / Self Care | Payer: 59 | Attending: Physician Assistant | Admitting: Physician Assistant

## 2021-12-24 DIAGNOSIS — M7731 Calcaneal spur, right foot: Secondary | ICD-10-CM

## 2021-12-24 DIAGNOSIS — J209 Acute bronchitis, unspecified: Secondary | ICD-10-CM | POA: Diagnosis not present

## 2021-12-24 DIAGNOSIS — R059 Cough, unspecified: Secondary | ICD-10-CM | POA: Diagnosis not present

## 2021-12-24 DIAGNOSIS — M722 Plantar fascial fibromatosis: Secondary | ICD-10-CM | POA: Diagnosis not present

## 2021-12-24 DIAGNOSIS — M79671 Pain in right foot: Secondary | ICD-10-CM

## 2021-12-24 MED ORDER — PREDNISONE 50 MG PO TABS: 60.0000 mg | ORAL_TABLET | Freq: Every day | ORAL | Status: DC

## 2021-12-24 MED ORDER — PREDNISONE 50 MG PO TABS
60.0000 mg | ORAL_TABLET | Freq: Once | ORAL | Status: AC
  Administered 2021-12-24: 60 mg via ORAL

## 2021-12-24 MED ORDER — TRIAMCINOLONE ACETONIDE 10 MG/ML IJ SUSP
10.0000 mg | Freq: Once | INTRAMUSCULAR | Status: AC
  Administered 2021-12-24: 10 mg

## 2021-12-24 MED ORDER — ALBUTEROL SULFATE HFA 108 (90 BASE) MCG/ACT IN AERS
2.0000 | INHALATION_SPRAY | Freq: Four times a day (QID) | RESPIRATORY_TRACT | 1 refills | Status: DC | PRN
  Filled 2021-12-24: qty 8.5, 25d supply, fill #0

## 2021-12-24 MED ORDER — MELOXICAM 15 MG PO TABS
15.0000 mg | ORAL_TABLET | Freq: Every day | ORAL | 0 refills | Status: DC | PRN
  Filled 2021-12-24: qty 30, 30d supply, fill #0

## 2021-12-24 MED ORDER — ALBUTEROL SULFATE HFA 108 (90 BASE) MCG/ACT IN AERS
2.0000 | INHALATION_SPRAY | Freq: Once | RESPIRATORY_TRACT | Status: AC
  Administered 2021-12-24: 2 via RESPIRATORY_TRACT

## 2021-12-24 MED ORDER — PREDNISONE 50 MG PO TABS
50.0000 mg | ORAL_TABLET | Freq: Every day | ORAL | 0 refills | Status: DC
  Filled 2021-12-24: qty 5, 5d supply, fill #0

## 2021-12-24 NOTE — ED Provider Notes (Signed)
[facility]    CSN: [id number] Arrival date & time: 12/24/21  1644      History   Chief Complaint Chief Complaint  Patient presents with   Cough    HPI [name] is a 51 y.o. female.   Pt complains of a a cough and congestion.  Pt reports no fever.  Pt reports she has had bronchitis in the past.   The history is provided by the patient. No language interpreter was used.  Cough Cough characteristics:  Non-productive Sputum characteristics:  Nondescript Severity:  Moderate Timing:  Constant Progression:  Worsening Chronicity:  New   Past Medical History:  Diagnosis Date   GERD (gastroesophageal reflux disease)    Hyperlipidemia    Iron deficiency anemia    Obesity     Patient Active Problem List   Diagnosis Date Noted   Mixed hyperlipidemia 08/23/2019   Iron deficiency anemia 04/22/2018   Sciatic neuritis 09/27/2012   Morbid obesity (HCC) 09/27/2012    Past Surgical History:  Procedure Laterality Date   CHOLECYSTECTOMY     TUBAL LIGATION      OB History     Gravida  1   Para      Term      Preterm      AB      Living  1      SAB      IAB      Ectopic      Multiple      Live Births               Home Medications    Prior to Admission medications   Medication Sig Start Date End Date Taking? Authorizing Provider  albuterol (VENTOLIN HFA) 108 (90 Base) MCG/ACT inhaler Inhale 2 puffs into the lungs every 6 (six) hours as needed for wheezing or shortness of breath. 12/24/21  Yes Cheron Schaumann K, PA-C  predniSONE (DELTASONE) 50 MG tablet One tablet a day 12/24/21  Yes Elson Areas, PA-C  ferrous sulfate 325 (65 FE) MG EC tablet Take 1 tablet (325 mg total) by mouth 2 (two) times daily. 04/27/20 04/27/21  Arnette Felts, FNP  Lactobacillus-Inulin (CULTURELLE DIGESTIVE HEALTH) CAPS Take 1 capsule by mouth daily.    [provider]  loperamide (IMODIUM) 2 MG capsule Take 2 mg by mouth as needed for diarrhea or  loose stools.    [provider]  meloxicam (MOBIC) 15 MG tablet Take 1 tablet (15 mg total) by mouth daily as needed for pain. 12/24/21 12/24/22  Vivi Barrack, DPM  Sod Picosulfate-Mag Ox-Cit Acd (CLENPIQ) 10-3.5-12 MG-GM -GM/160ML SOLN Use as directed per office and not instructions on packaging. Do not refrigerate. 11/06/21       Family History Family History  Problem Relation Age of Onset   Colon cancer Mother    Multiple sclerosis Father    Diabetes Father    Hypertension Father    Sarcoidosis Sister    Diabetes Sister    Hypertension Sister    Healthy Son    Breast cancer Neg Hx     Social History Social History   Tobacco Use   Smoking status: Never   Smokeless tobacco: Never  Vaping Use   Vaping Use: Never used  Substance Use Topics   Alcohol use: No   Drug use: No     Allergies   Patient has no known allergies.   Review of Systems Review of Systems  Respiratory:  Positive for cough.   All other systems reviewed and are negative.    Physical Exam Triage Vital Signs ED Triage Vitals  Enc Vitals Group     BP 12/24/21 1657 112/76     Pulse Rate 12/24/21 1657 86     Resp 12/24/21 1657 18     Temp 12/24/21 1657 98.7 F (37.1 C)     Temp Source 12/24/21 1657 Oral     SpO2 12/24/21 1657 95 %     Weight --      Height --      Head Circumference --      Peak Flow --      Pain Score 12/24/21 1658 0     Pain Loc --      Pain Edu? --      Excl. in GC? --    No data found.  Updated Vital Signs BP 112/76 (BP Location: Right Arm)   Pulse 86   Temp 98.7 F (37.1 C) (Oral)   Resp 18   LMP 12/22/2021   SpO2 95%   Visual Acuity Right Eye Distance:   Left Eye Distance:   Bilateral Distance:    Right Eye Near:   Left Eye Near:    Bilateral Near:     Physical Exam Vitals and nursing note reviewed.  Constitutional:      Appearance: She is well-developed.  HENT:     Head: Normocephalic.  Cardiovascular:     Rate and Rhythm: Normal  rate.  Pulmonary:     Breath sounds: Wheezing and rhonchi present.  Abdominal:     General: There is no distension.  Musculoskeletal:        General: Normal range of motion.     Cervical back: Normal range of motion.  Neurological:     Mental Status: She is alert and oriented to person, place, and time.      UC Treatments / Results  Labs (all labs ordered are listed, but only abnormal results are displayed) Labs Reviewed - No data to display  EKG   Radiology DG Chest 2 View  Result Date: 12/24/2021 CLINICAL DATA:  Cough and congestion since Saturday EXAM: CHEST - 2 VIEW COMPARISON:  03/10/2021 acute abdomen series FINDINGS: The heart size and mediastinal contours are within normal limits. Both lungs are clear. The visualized skeletal structures are unremarkable. IMPRESSION: No active cardiopulmonary disease. Electronically Signed   By: Jeronimo GreavesKyle  Talbot M.D.   On: 12/24/2021 17:42    Procedures Procedures (including critical care time)  Medications Ordered in UC Medications  predniSONE (DELTASONE) tablet 60 mg (has no administration in time range)  albuterol (VENTOLIN HFA) 108 (90 Base) MCG/ACT inhaler 2 puff (2 puffs Inhalation Given 12/24/21 1729)  predniSONE (DELTASONE) tablet 60 mg (60 mg Oral Given 12/24/21 1814)    Initial Impression / Assessment and Plan / UC Course  I have reviewed the triage vital signs and the nursing notes.  Pertinent labs & imaging results that were available during my care of the patient were reviewed by me and considered in my medical decision making (see chart for details).     MDM:  Pt given prednisone 60 mg and albuterol inhaler.  Pt given rx for prednisone  Final Clinical Impressions(s) / UC Diagnoses   Final diagnoses:  Acute bronchitis, unspecified organism     Discharge Instructions      Return if any problems.    ED Prescriptions     Medication Sig Dispense Auth. Provider  predniSONE (DELTASONE) 50 MG tablet One tablet a day 5  tablet Kebin Maye K, PA-C   albuterol (VENTOLIN HFA) 108 (90 Base) MCG/ACT inhaler Inhale 2 puffs into the lungs every 6 (six) hours as needed for wheezing or shortness of breath. 8 g Elson Areas, [place]      PDMP not reviewed this encounter. An After Visit Summary was printed and given to the patient.    Elson Areas, [place] 12/24/21 1826

## 2021-12-24 NOTE — Discharge Instructions (Signed)
Return if any problems.

## 2021-12-24 NOTE — ED Triage Notes (Signed)
C/o cough and congestion since Sunday.

## 2021-12-24 NOTE — Patient Instructions (Signed)

## 2021-12-25 ENCOUNTER — Other Ambulatory Visit (HOSPITAL_COMMUNITY): Payer: Self-pay

## 2021-12-27 NOTE — Progress Notes (Signed)
Subjective:   Patient ID: [name], female   DOB: 51 y.o.   MRN: [id number]   HPI 51 year old female presents the office today with concerns of right heel pain.  She states that this started about a month ago.  States throbbing sensation about her heel.  She states that it hurts more when she works her second job as she is on her feet the entire time.  Some swelling she has noticed but no numbness or tingling.  No injuries.  No recent treatment.  She has no other concerns today.   Review of Systems  All other systems reviewed and are negative.  Past Medical History:  Diagnosis Date   GERD (gastroesophageal reflux disease)    Hyperlipidemia    Iron deficiency anemia    Obesity     Past Surgical History:  Procedure Laterality Date   CHOLECYSTECTOMY     TUBAL LIGATION       Current Outpatient Medications:    meloxicam (MOBIC) 15 MG tablet, Take 1 tablet (15 mg total) by mouth daily as needed for pain., Disp: 30 tablet, Rfl: 0   albuterol (VENTOLIN HFA) 108 (90 Base) MCG/ACT inhaler, Inhale 2 puffs into the lungs every 6 (six) hours as needed for wheezing or shortness of breath., Disp: 8.5 g, Rfl: 1   ferrous sulfate 325 (65 FE) MG EC tablet, Take 1 tablet (325 mg total) by mouth 2 (two) times daily., Disp: 60 tablet, Rfl: 3   Lactobacillus-Inulin (CULTURELLE DIGESTIVE HEALTH) CAPS, Take 1 capsule by mouth daily., Disp: , Rfl:    loperamide (IMODIUM) 2 MG capsule, Take 2 mg by mouth as needed for diarrhea or loose stools., Disp: , Rfl:    predniSONE (DELTASONE) 50 MG tablet, Take 1 tablet (50 mg total) by mouth daily., Disp: 5 tablet, Rfl: 0   Sod Picosulfate-Mag Ox-Cit Acd (CLENPIQ) 10-3.5-12 MG-GM -GM/160ML SOLN, Use as directed per office and not instructions on packaging. Do not refrigerate., Disp: 320 mL, Rfl: 0  No Known Allergies        Objective:  Physical Exam  General: AAO x3, NAD  Dermatological: Skin is warm, dry and supple bilateral. There are no open  sores, no preulcerative lesions, no rash or signs of infection present.  Vascular: Dorsalis Pedis artery and Posterior Tibial artery pedal pulses are 2/4 bilateral with immedate capillary fill time.  There is no pain with calf compression, swelling, warmth, erythema.   Neruologic: Grossly intact via light touch bilateral.  Negative Tinel sign.  Musculoskeletal: Tenderness to palpation along the plantar medial tubercle of the calcaneus at the insertion of plantar fascia on the right foot. There is no pain along the course of the plantar fascia within the arch of the foot. Plantar fascia appears to be intact. There is no pain with lateral compression of the calcaneus or pain with vibratory sensation. There is no pain along the course or insertion of the achilles tendon. No other areas of tenderness to bilateral lower extremities. Muscular strength 5/5 in all groups tested bilateral.  Gait: Unassisted, Nonantalgic.       Assessment:   Right heel pain, Plantar fasciitis     Plan:  -Treatment options discussed including all alternatives, risks, and complications -Etiology of symptoms were discussed -X-rays were obtained and reviewed with the patient.  3 views of the right foot were obtained.  No evidence of acute fracture.  Calcaneal spurring is present.  Decreased calcaneal inclination angle. -Steroid injection performed.  See procedure note below. -  Prescribed mobic. Discussed side effects of the medication and directed to stop if any are to occur and call the office.  -Plantar fascia brace dispensed today to aid in support and stability.  Discussed shoe modifications good arch support.  Stretching, icing daily.  Procedure: Injection Tendon/Ligament Discussed alternatives, risks, complications and verbal consent was obtained.  Location: Right plantar fascia at the glabrous junction; medial approach. Skin Prep: Alcohol. Injectate: 0.5cc 0.5% marcaine plain, 0.5 cc 2% lidocaine plain and, 1 cc  kenalog 10. Disposition: Patient tolerated procedure well. Injection site dressed with a band-aid.  Post-injection care was discussed and return precautions discussed.   No follow-ups on file.  Vivi Barrack DPM

## 2022-04-14 ENCOUNTER — Other Ambulatory Visit: Payer: Self-pay | Admitting: Podiatry

## 2022-04-14 DIAGNOSIS — M7731 Calcaneal spur, right foot: Secondary | ICD-10-CM

## 2022-06-23 ENCOUNTER — Other Ambulatory Visit (HOSPITAL_COMMUNITY): Payer: Self-pay

## 2022-06-23 ENCOUNTER — Ambulatory Visit
Admission: EM | Admit: 2022-06-23 | Discharge: 2022-06-23 | Disposition: A | Discharge status: Home / Self Care | Payer: 59 | Attending: Emergency Medicine | Admitting: Emergency Medicine

## 2022-06-23 DIAGNOSIS — J22 Unspecified acute lower respiratory infection: Secondary | ICD-10-CM | POA: Insufficient documentation

## 2022-06-23 DIAGNOSIS — J00 Acute nasopharyngitis [common cold]: Secondary | ICD-10-CM | POA: Diagnosis not present

## 2022-06-23 DIAGNOSIS — B9789 Other viral agents as the cause of diseases classified elsewhere: Secondary | ICD-10-CM | POA: Insufficient documentation

## 2022-06-23 DIAGNOSIS — J4521 Mild intermittent asthma with (acute) exacerbation: Secondary | ICD-10-CM | POA: Insufficient documentation

## 2022-06-23 DIAGNOSIS — Z1152 Encounter for screening for COVID-19: Secondary | ICD-10-CM | POA: Diagnosis not present

## 2022-06-23 DIAGNOSIS — R062 Wheezing: Secondary | ICD-10-CM

## 2022-06-23 LAB — RESP PANEL BY RT-PCR (FLU A&B, COVID) ARPGX2
Influenza A by PCR: NEGATIVE
Influenza B by PCR: NEGATIVE
SARS Coronavirus 2 by RT PCR: NEGATIVE

## 2022-06-23 MED ORDER — METHYLPREDNISOLONE SODIUM SUCC 125 MG IJ SOLR
80.0000 mg | Freq: Once | INTRAMUSCULAR | Status: AC
Start: 2022-06-23 — End: 2022-06-23
  Administered 2022-06-23: 80 mg via INTRAMUSCULAR

## 2022-06-23 MED ORDER — IPRATROPIUM BROMIDE 0.06 % NA SOLN
2.0000 | Freq: Three times a day (TID) | NASAL | 1 refills | Status: DC
  Filled 2022-06-23: qty 15, 14d supply, fill #0

## 2022-06-23 MED ORDER — CETIRIZINE HCL 10 MG PO TABS
10.0000 mg | ORAL_TABLET | Freq: Every day | ORAL | 1 refills | Status: AC
  Filled 2022-06-23: qty 90, 90d supply, fill #0

## 2022-06-23 MED ORDER — ALBUTEROL SULFATE HFA 108 (90 BASE) MCG/ACT IN AERS
2.0000 | INHALATION_SPRAY | Freq: Four times a day (QID) | RESPIRATORY_TRACT | 2 refills | Status: AC | PRN
  Filled 2022-06-23: qty 6.7, 25d supply, fill #0

## 2022-06-23 MED ORDER — ALBUTEROL SULFATE (2.5 MG/3ML) 0.083% IN NEBU
2.5000 mg | INHALATION_SOLUTION | Freq: Once | RESPIRATORY_TRACT | Status: AC
  Administered 2022-06-23: 2.5 mg via RESPIRATORY_TRACT

## 2022-06-23 MED ORDER — FLUTICASONE PROPIONATE 50 MCG/ACT NA SUSP
1.0000 | Freq: Every day | NASAL | 2 refills | Status: DC
  Filled 2022-06-23: qty 16, 60d supply, fill #0

## 2022-06-23 NOTE — Discharge Instructions (Addendum)
At this time, I believe that you are dealing with a viral bronchitis as well as some upper respiratory inflammation that might be causing postnasal drip which is triggering you to cough.  You received a COVID-19 and influenza PCR test today.  The results of your PCR testing will be posted to your MyChart once it is complete.  This typically takes 6 to 12 hours.    If your influenza PCR test is positive, you will be contacted by phone.  Please complete full 5-day course of Tamiflu, a prescription will be provided for you.     If your COVID-19 PCR test is positive, you will be contacted by phone.  Please discuss with the callback nurse whether or not you would benefit from antiviral therapy treatment for COVID-19.    Based on my physical exam findings and the history you have provided  today, I do not recommend antibiotics at this time.  I do not believe the risks and side effects of antibiotics would outweigh any minimal benefit that they might provide.         Please read below to learn more about the medications, dosages and frequencies that I recommend to help alleviate your symptoms and to get you feeling better soon:   Zyrtec (cetirizine): This is an excellent second-generation antihistamine that helps to reduce respiratory inflammatory response to environmental allergens.  In some patients, this medication can cause daytime sleepiness so I recommend that you take 1 tablet daily at bedtime.     Flonase (fluticasone): This is a steroid nasal spray that you use once daily, 1 spray in each nare.  This medication does not work well if you decide to use it only used as you feel you need to, it works best used on a daily basis.  After 3 to 5 days of use, you will notice significant reduction of the inflammation and mucus production that is currently being caused by exposure to allergens, whether seasonal or environmental.  The most common side effect of this medication is nosebleeds.  If you experience a  nosebleed, please discontinue use for 1 week, then feel free to resume.    Atrovent (ipratropium): This is an excellent nasal decongestant spray I have added to your recommended nasal steroid that will not cause rebound congestion, please instill 2 sprays into each nare with each use.  Because nasal steroids can take several days before they begin to provide full benefit, I recommend that you use this spray in addition to the nasal steroid prescribed for you.  Please use it after you have used your nasal steroid and repeat up to 3 times daily as needed.    ProAir, Ventolin, Proventil (albuterol): Please inhale 2 puffs 4 times daily for the next several days.  This inhaled medication contains a short acting beta agonist bronchodilator.  This medication works on the smooth muscle that opens and constricts of your airways by relaxing the muscle.  The result of relaxation of the smooth muscle is increased air movement and improved work of breathing.  This is a short acting medication that can be used every 4-6 hours as needed for increased work of breathing, shortness of breath, wheezing and excessive coughing.     Advil, Motrin (ibuprofen): This is a good anti-inflammatory medication which not only addresses aches, pains but also significantly reduces soft tissue inflammation of the upper airways that causes sinus and nasal congestion as well as inflammation of the lower airways which makes you feel like your  breathing is constricted or your cough feel tight.  I recommend that you take 400 mg every 8 hours as needed.      Robitussin, Mucinex (guaifenesin): This is an expectorant.  This helps break up chest congestion and loosen up thick nasal drainage making phlegm and drainage more liquid and therefore easier to remove.  I recommend being 400 mg three times daily as needed.      If you find that you have not had improvement of your symptoms in the next 5 to 7 days, please follow-up with your primary care  provider or return here to urgent care for repeat evaluation and further recommendations.  Thank you for visiting urgent care today.  We appreciate the opportunity to participate in your care.

## 2022-06-23 NOTE — ED Triage Notes (Signed)
The patient states Monday she began having cough and congestion.   Home interventions: mucinex

## 2022-06-23 NOTE — ED Provider Notes (Signed)
[facility]    CSN: [id number] Arrival date & time: 06/23/22  1506    HISTORY   Chief Complaint  Patient presents with   Cough   Nasal Congestion   HPI [name] is a pleasant, 51 y.o. female who presents to urgent care today. Patient complains of a 2-day history of nonproductive cough and nasal congestion with clear rhinorrhea.  Patient states she is also had some body aches and chills but no known fever.  Patient states has been taking Mucinex without meaningful relief of her symptoms.  Patient denies a history of allergies and asthma.  Patient has essentially normal vital signs on arrival today with the exception of a slightly diminished oxygen saturation of 95%.  Patient states she has a left over albuterol inhaler from her previous episode of bronchitis which was in June of this year, states she inhaled a few puffs but did not feel that it helped.  Patient states she also tried taking Mucinex which also did not help.  Patient denies nausea, vomiting, diarrhea, headache, loss of taste or smell, otalgia, sore throat.  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  The history is provided by the patient.   Past Medical History:  Diagnosis Date   GERD (gastroesophageal reflux disease)    Hyperlipidemia    Iron deficiency anemia    Obesity    Patient Active Problem List   Diagnosis Date Noted   Mixed hyperlipidemia 08/23/2019   Iron deficiency anemia 04/22/2018   Sciatic neuritis 09/27/2012   Morbid obesity (HCC) 09/27/2012   Past Surgical History:  Procedure Laterality Date   CHOLECYSTECTOMY     TUBAL LIGATION     OB History     Gravida  1   Para      Term      Preterm      AB      Living  1      SAB      IAB      Ectopic      Multiple      Live Births             Home Medications    Prior to Admission medications   Not on File    Family History Family History  Problem  Relation Age of Onset   Colon cancer Mother    Multiple sclerosis Father    Diabetes Father    Hypertension Father    Sarcoidosis Sister    Diabetes Sister    Hypertension Sister    Healthy Son    Breast cancer Neg Hx    Social History Social History   Tobacco Use   Smoking status: Never   Smokeless tobacco: Never  Vaping Use   Vaping Use: Never used  Substance Use Topics   Alcohol use: No   Drug use: No   Allergies   Patient has no known allergies.  Review of Systems Review of Systems Pertinent findings revealed after performing a 14 point review of systems has been noted in the history of present illness.  Physical Exam Triage Vital Signs ED Triage Vitals  Enc Vitals Group     BP 05/15/21 0827 (!) 147/82     Pulse Rate 05/15/21 0827 72     Resp 05/15/21 0827 18     Temp 05/15/21 0827 98.3 F (36.8 C)     Temp Source 05/15/21 0827 Oral     SpO2  05/15/21 0827 98 %     Weight --      Height --      Head Circumference --      Peak Flow --      Pain Score 05/15/21 0826 5     Pain Loc --      Pain Edu? --      Excl. in GC? --   No data found.  Updated Vital Signs BP 129/84 (BP Location: Right Arm)   Pulse 90   Temp 99.5 F (37.5 C) (Oral)   Resp 17   LMP 06/02/2022 (Approximate)   SpO2 95%   Physical Exam Vitals and nursing note reviewed.  Constitutional:      General: She is not in acute distress.    Appearance: Normal appearance. She is not ill-appearing.  HENT:     Head: Normocephalic and atraumatic.     Salivary Glands: Right salivary gland is not diffusely enlarged or tender. Left salivary gland is not diffusely enlarged or tender.     Right Ear: Ear canal and external ear normal. No drainage. No middle ear effusion. There is no impacted cerumen. Tympanic membrane is bulging. Tympanic membrane is not injected or erythematous.     Left Ear: Ear canal and external ear normal. No drainage.  No middle ear effusion. There is no impacted cerumen.  Tympanic membrane is bulging. Tympanic membrane is not injected or erythematous.     Ears:     Comments: Bilateral EACs normal, both TMs bulging with clear fluid    Nose: Rhinorrhea present. No nasal deformity, septal deviation, signs of injury, nasal tenderness, mucosal edema or congestion. Rhinorrhea is clear.     Right Nostril: Occlusion present. No foreign body, epistaxis or septal hematoma.     Left Nostril: Occlusion present. No foreign body, epistaxis or septal hematoma.     Right Turbinates: Enlarged, swollen and pale.     Left Turbinates: Enlarged, swollen and pale.     Right Sinus: No maxillary sinus tenderness or frontal sinus tenderness.     Left Sinus: No maxillary sinus tenderness or frontal sinus tenderness.     Mouth/Throat:     Lips: Pink. No lesions.     Mouth: Mucous membranes are moist. No oral lesions.     Tongue: No lesions. Tongue does not deviate from midline.     Palate: No mass and lesions.     Pharynx: Oropharynx is clear. Uvula midline. No pharyngeal swelling, oropharyngeal exudate, posterior oropharyngeal erythema or uvula swelling.     Tonsils: No tonsillar exudate. 0 on the right. 0 on the left.     Comments: Postnasal drip Eyes:     General: Lids are normal.        Right eye: No discharge.        Left eye: No discharge.     Extraocular Movements: Extraocular movements intact.     Conjunctiva/sclera: Conjunctivae normal.     Right eye: Right conjunctiva is not injected.     Left eye: Left conjunctiva is not injected.  Neck:     Trachea: Trachea and phonation normal.  Cardiovascular:     Rate and Rhythm: Normal rate and regular rhythm.     Pulses: Normal pulses.     Heart sounds: Normal heart sounds. No murmur heard.    No friction rub. No gallop.  Pulmonary:     Effort: Pulmonary effort is normal. No tachypnea, bradypnea, accessory muscle usage, prolonged expiration, respiratory distress or retractions.  Breath sounds: No stridor, decreased air  movement or transmitted upper airway sounds. Examination of the right-upper field reveals wheezing. Examination of the left-upper field reveals wheezing. Examination of the right-middle field reveals wheezing. Examination of the left-middle field reveals wheezing. Examination of the right-lower field reveals wheezing. Examination of the left-lower field reveals wheezing. Wheezing present. No decreased breath sounds, rhonchi or rales.  Chest:     Chest wall: No tenderness.  Musculoskeletal:        General: Normal range of motion.     Cervical back: Normal range of motion and neck supple. Normal range of motion.  Lymphadenopathy:     Cervical: Cervical adenopathy present.     Right cervical: Superficial cervical adenopathy and posterior cervical adenopathy present.     Left cervical: Superficial cervical adenopathy and posterior cervical adenopathy present.  Skin:    General: Skin is warm and dry.     Findings: No erythema or rash.  Neurological:     General: No focal deficit present.     Mental Status: She is alert and oriented to person, place, and time.  Psychiatric:        Mood and Affect: Mood normal.        Behavior: Behavior normal.     Visual Acuity Right Eye Distance:   Left Eye Distance:   Bilateral Distance:    Right Eye Near:   Left Eye Near:    Bilateral Near:     [facility] Couse / Diagnostics / Procedures:     Radiology No results found.  Procedures Procedures (including critical care time) EKG  Pending results:  Labs Reviewed  RESP PANEL BY RT-PCR (FLU A&B, COVID) [id number]    Medications Ordered in UC: Medications  methylPREDNISolone sodium succinate (SOLU-MEDROL) 125 mg/2 mL injection 80 mg (80 mg Intramuscular Given 06/23/22 1613)  albuterol (PROVENTIL) (2.5 MG/3ML) 0.083% nebulizer solution 2.5 mg (2.5 mg Nebulization Given 06/23/22 1614)    UC Diagnoses / Final Clinical Impressions(s)   I have reviewed the triage vital signs and the nursing  notes.  Pertinent labs & imaging results that were available during my care of the patient were reviewed by me and considered in my medical decision making (see chart for details).    Final diagnoses:  Mild intermittent reactive airway disease with acute exacerbation  Expiratory wheezing  Acute rhinitis  Viral infection of lower respiratory system   Patient had a good response to albuterol and Solu-Medrol injection during her visit today.  Recommend that she continue albuterol.  COVID-19 and influenza testing performed, will notify patient of results once received.  Patient would still benefit from Tamiflu for influenza and recommend that she be prescribed Paxlovid due to her history of recurrent bronchitis, morbid obesity; last GFR was April 15, 2021 and was 117 with a serum creatinine of 0.47, patient has normal blood pressure and does not currently take any medications on a regular basis, recommend regular dosing for Paxlovid if COVID-19 test is positive.  Please see discharge instructions below for further details of plan of care.  Patient provided with allergy medications given concerns for allergic rhinitis on physical exam.  Patient also provided with a renewal of her albuterol advised to use 4 times daily for the next several days to keep lungs open.  Patient also advised to continue Mucinex that she has been taking.  If both COVID-19 and influenza tests are negative and patient still feels like she is wheezing, recommend short course of methylprednisolone 60 mg x  3 days.  ED Prescriptions     Medication Sig Dispense Auth. Provider   cetirizine (ZYRTEC ALLERGY) 10 MG tablet Take 1 tablet (10 mg total) by mouth at bedtime. 90 tablet Theadora Rama Scales, PA-C   fluticasone (FLONASE) 50 MCG/ACT nasal spray Place 1 spray into both nostrils daily. Begin by using 2 sprays in each nostril daily for 3 to 5 days, then decrease to 1 spray  daily. 16 g Theadora Rama Scales, PA-C    ipratropium (ATROVENT) 0.06 % nasal spray Place 2 sprays into both nostrils 3 (three) times daily. As needed for nasal congestion, runny nose 15 mL Theadora Rama Scales, PA-C   albuterol (VENTOLIN HFA) 108 (90 Base) MCG/ACT inhaler Inhale 2 puffs into the lungs every 6 (six) hours as needed for wheezing or shortness of breath (Cough). 36 g Theadora Rama Scales, PA-C      PDMP not reviewed this encounter.  Disposition Upon Discharge:  Condition: stable for discharge home Home: take medications as prescribed; routine discharge instructions as discussed; follow up as advised.  Patient presented with an acute illness with associated systemic symptoms and significant discomfort requiring urgent management. In my opinion, this is a condition that a prudent lay person (someone who possesses an average knowledge of health and medicine) may potentially expect to result in complications if not addressed urgently such as respiratory distress, impairment of bodily function or dysfunction of bodily organs.   Routine symptom specific, illness specific and/or disease specific instructions were discussed with the patient and/or caregiver at length.   As such, the patient has been evaluated and assessed, work-up was performed and treatment was provided in alignment with urgent care protocols and evidence based medicine.  Patient/parent/caregiver has been advised that the patient may require follow up for further testing and treatment if the symptoms continue in spite of treatment, as clinically indicated and appropriate.  If the patient was tested for COVID-19, Influenza and/or RSV, then the patient/parent/guardian was advised to isolate at home pending the results of his/her diagnostic coronavirus test and potentially longer if they're positive. I have also advised pt that if his/her COVID-19 test returns positive, it's recommended to self-isolate for at least 10 days after symptoms first appeared AND until  fever-free for 24 hours without fever reducer AND other symptoms have improved or resolved. Discussed self-isolation recommendations as well as instructions for household member/close contacts as per the [facility] and [place] DHHS, and also gave patient the COVID packet with this information.  Patient/parent/caregiver has been advised to return to the [facility] or PCP in 3-5 days if no better; to PCP or the Emergency Department if new signs and symptoms develop, or if the current signs or symptoms continue to change or worsen for further workup, evaluation and treatment as clinically indicated and appropriate  The patient will follow up with their current PCP if and as advised. If the patient does not currently have a PCP we will assist them in obtaining one.   The patient may need specialty follow up if the symptoms continue, in spite of conservative treatment and management, for further workup, evaluation, consultation and treatment as clinically indicated and appropriate.  Patient/parent/caregiver verbalized understanding and agreement of plan as discussed.  All questions were addressed during visit.  Please see discharge instructions below for further details of plan.  Discharge Instructions:   Discharge Instructions      At this time, I believe that you are dealing with a viral bronchitis as well as some upper respiratory  inflammation that might be causing postnasal drip which is triggering you to cough.  You received a COVID-19 and influenza PCR test today.  The results of your PCR testing will be posted to your MyChart once it is complete.  This typically takes 6 to 12 hours.    If your influenza PCR test is positive, you will be contacted by phone.  Please complete full 5-day course of Tamiflu, a prescription will be provided for you.     If your COVID-19 PCR test is positive, you will be contacted by phone.  Please discuss with the callback nurse whether or not you would benefit from antiviral therapy  treatment for COVID-19.    Based on my physical exam findings and the history you have provided  today, I do not recommend antibiotics at this time.  I do not believe the risks and side effects of antibiotics would outweigh any minimal benefit that they might provide.         Please read below to learn more about the medications, dosages and frequencies that I recommend to help alleviate your symptoms and to get you feeling better soon:   Zyrtec (cetirizine): This is an excellent second-generation antihistamine that helps to reduce respiratory inflammatory response to environmental allergens.  In some patients, this medication can cause daytime sleepiness so I recommend that you take 1 tablet daily at bedtime.     Flonase (fluticasone): This is a steroid nasal spray that you use once daily, 1 spray in each nare.  This medication does not work well if you decide to use it only used as you feel you need to, it works best used on a daily basis.  After 3 to 5 days of use, you will notice significant reduction of the inflammation and mucus production that is currently being caused by exposure to allergens, whether seasonal or environmental.  The most common side effect of this medication is nosebleeds.  If you experience a nosebleed, please discontinue use for 1 week, then feel free to resume.    Atrovent (ipratropium): This is an excellent nasal decongestant spray I have added to your recommended nasal steroid that will not cause rebound congestion, please instill 2 sprays into each nare with each use.  Because nasal steroids can take several days before they begin to provide full benefit, I recommend that you use this spray in addition to the nasal steroid prescribed for you.  Please use it after you have used your nasal steroid and repeat up to 3 times daily as needed.    ProAir, Ventolin, Proventil (albuterol): Please inhale 2 puffs 4 times daily for the next several days.  This inhaled medication  contains a short acting beta agonist bronchodilator.  This medication works on the smooth muscle that opens and constricts of your airways by relaxing the muscle.  The result of relaxation of the smooth muscle is increased air movement and improved work of breathing.  This is a short acting medication that can be used every 4-6 hours as needed for increased work of breathing, shortness of breath, wheezing and excessive coughing.     Advil, Motrin (ibuprofen): This is a good anti-inflammatory medication which not only addresses aches, pains but also significantly reduces soft tissue inflammation of the upper airways that causes sinus and nasal congestion as well as inflammation of the lower airways which makes you feel like your breathing is constricted or your cough feel tight.  I recommend that you take 400 mg every 8  hours as needed.      Robitussin, Mucinex (guaifenesin): This is an expectorant.  This helps break up chest congestion and loosen up thick nasal drainage making phlegm and drainage more liquid and therefore easier to remove.  I recommend being 400 mg three times daily as needed.      If you find that you have not had improvement of your symptoms in the next 5 to 7 days, please follow-up with your primary care provider or return here to urgent care for repeat evaluation and further recommendations.  Thank you for visiting urgent care today.  We appreciate the opportunity to participate in your care.         This office note has been dictated using Teaching laboratory technician.  Unfortunately, this method of dictation can sometimes lead to typographical or grammatical errors.  I apologize for your inconvenience in advance if this occurs.  Please do not hesitate to reach out to me if clarification is needed.      Theadora Rama Scales, PA-C 06/23/22 1640

## 2022-10-11 DIAGNOSIS — H5203 Hypermetropia, bilateral: Secondary | ICD-10-CM | POA: Diagnosis not present

## 2022-10-11 DIAGNOSIS — H524 Presbyopia: Secondary | ICD-10-CM | POA: Diagnosis not present

## 2022-11-24 ENCOUNTER — Other Ambulatory Visit (HOSPITAL_COMMUNITY): Payer: Self-pay

## 2022-11-24 ENCOUNTER — Ambulatory Visit
Admission: EM | Admit: 2022-11-24 | Discharge: 2022-11-24 | Disposition: A | Discharge status: Home / Self Care | Payer: Commercial Managed Care - PPO | Attending: Nurse Practitioner | Admitting: Nurse Practitioner

## 2022-11-24 DIAGNOSIS — Z1152 Encounter for screening for COVID-19: Secondary | ICD-10-CM | POA: Diagnosis not present

## 2022-11-24 DIAGNOSIS — B349 Viral infection, unspecified: Secondary | ICD-10-CM | POA: Diagnosis not present

## 2022-11-24 DIAGNOSIS — J029 Acute pharyngitis, unspecified: Secondary | ICD-10-CM

## 2022-11-24 LAB — POCT RAPID STREP A (OFFICE): Rapid Strep A Screen: NEGATIVE

## 2022-11-24 MED ORDER — ACETAMINOPHEN 325 MG PO TABS
650.0000 mg | ORAL_TABLET | Freq: Once | ORAL | Status: AC
  Administered 2022-11-24: 650 mg via ORAL

## 2022-11-24 MED ORDER — LIDOCAINE VISCOUS HCL 2 % MT SOLN
15.0000 mL | OROMUCOSAL | 0 refills | Status: DC | PRN
Start: 2022-11-24 — End: 2023-06-30
  Filled 2022-11-24: qty 100, 2d supply, fill #0

## 2022-11-24 NOTE — Discharge Instructions (Addendum)
Your strep swab was negative for strep throat in the clinic.  The clinical symptoms for culture and contact if positive.  Will also contact you if your COVID PCR is positive as well Lidocaine gargle as needed.  Gargle and spit do not swallow You may also do salt water gargles and warm liquids such as tea and honey Over-the-counter Tylenol ibuprofen as needed Please follow-up with your PCP if your symptoms do not improve Please go to the ER if you develop any worsening symptoms I hope you feel better soon!

## 2022-11-24 NOTE — ED Provider Notes (Signed)
[facility]    CSN: [id number] Arrival date & time: 11/24/22  0802      History   Chief Complaint Chief Complaint  Patient presents with   Sore Throat    HPI [name] is a 52 y.o. female  presents for evaluation of URI symptoms for 2 days. Patient reports associated symptoms of sore throat, laryngitis, mild ear pain. Denies N/V/D, cough, congestion, fevers, body aches, shortness of breath. Patient does not have a hx of asthma or smoking. No known sick contacts.  Pt has taken Robitussin OTC for symptoms. Pt has no other concerns at this time.    Sore Throat    Past Medical History:  Diagnosis Date   GERD (gastroesophageal reflux disease)    Hyperlipidemia    Iron deficiency anemia    Obesity     Patient Active Problem List   Diagnosis Date Noted   Mixed hyperlipidemia 08/23/2019   Iron deficiency anemia 04/22/2018   Sciatic neuritis 09/27/2012   Morbid obesity (HCC) 09/27/2012    Past Surgical History:  Procedure Laterality Date   CHOLECYSTECTOMY     TUBAL LIGATION      OB History     Gravida  1   Para      Term      Preterm      AB      Living  1      SAB      IAB      Ectopic      Multiple      Live Births               Home Medications    Prior to Admission medications   Medication Sig Start Date End Date Taking? Authorizing Provider  lidocaine (XYLOCAINE) 2 % solution Use as directed 15 mLs in the mouth or throat every 4 (four) hours as needed (sore throat). Gargle and spit do not swallow 11/24/22  Yes Radford Pax, NP  albuterol (VENTOLIN HFA) 108 (90 Base) MCG/ACT inhaler Inhale 2 puffs into the lungs every 6 (six) hours as needed for wheezing or shortness of breath (Cough). 06/23/22   Theadora Rama Scales, PA-C  cetirizine (ZYRTEC ALLERGY) 10 MG tablet Take 1 tablet (10 mg total) by mouth at bedtime. 06/23/22 12/20/22  Theadora Rama Scales, PA-C  fluticasone (FLONASE) 50 MCG/ACT nasal spray Place 1 spray  into both nostrils daily. Begin by using 2 sprays in each nostril daily for 3 to 5 days, then decrease to 1 spray  daily. 06/23/22   Theadora Rama Scales, PA-C  ipratropium (ATROVENT) 0.06 % nasal spray Place 2 sprays into both nostrils 3 (three) times daily. As needed for nasal congestion, runny nose 06/23/22   Theadora Rama Scales, PA-C    Family History Family History  Problem Relation Age of Onset   Colon cancer Mother    Multiple sclerosis Father    Diabetes Father    Hypertension Father    Sarcoidosis Sister    Diabetes Sister    Hypertension Sister    Healthy Son    Breast cancer Neg Hx     Social History Social History   Tobacco Use   Smoking status: Never   Smokeless tobacco: Never  Vaping Use   Vaping Use: Never used  Substance Use Topics   Alcohol use: No   Drug use: No     Allergies   Patient has no known allergies.   Review of Systems Review of Systems  HENT:  Positive for ear pain and sore throat.        Laryngitis     Physical Exam Triage Vital Signs ED Triage Vitals  Enc Vitals Group     BP 11/24/22 0821 113/76     Pulse Rate 11/24/22 0821 (!) 103     Resp 11/24/22 0820 18     Temp 11/24/22 0821 99.2 F (37.3 C)     Temp Source 11/24/22 0821 Oral     SpO2 11/24/22 0821 94 %     Weight --      Height --      Head Circumference --      Peak Flow --      Pain Score 11/24/22 0819 8     Pain Loc --      Pain Edu? --      Excl. in GC? --    No data found.  Updated Vital Signs BP 113/76   Pulse (!) 103   Temp 99.2 F (37.3 C) (Oral)   Resp 18   LMP 10/25/2022 (Exact Date)   SpO2 94%   Visual Acuity Right Eye Distance:   Left Eye Distance:   Bilateral Distance:    Right Eye Near:   Left Eye Near:    Bilateral Near:     Physical Exam Vitals and nursing note reviewed.  Constitutional:      General: She is not in acute distress.    Appearance: She is well-developed. She is not ill-appearing.  HENT:     Head: Normocephalic  and atraumatic.     Right Ear: Tympanic membrane and ear canal normal.     Left Ear: Tympanic membrane and ear canal normal.     Nose: Congestion present.     Mouth/Throat:     Mouth: Mucous membranes are moist.     Pharynx: Oropharynx is clear. Uvula midline. Posterior oropharyngeal erythema present.     Tonsils: No tonsillar exudate or tonsillar abscesses.  Eyes:     Conjunctiva/sclera: Conjunctivae normal.     Pupils: Pupils are equal, round, and reactive to light.  Cardiovascular:     Rate and Rhythm: Normal rate and regular rhythm.     Heart sounds: Normal heart sounds.  Pulmonary:     Effort: Pulmonary effort is normal.     Breath sounds: Normal breath sounds.  Musculoskeletal:     Cervical back: Normal range of motion and neck supple.  Lymphadenopathy:     Cervical: No cervical adenopathy.  Skin:    General: Skin is warm and dry.  Neurological:     General: No focal deficit present.     Mental Status: She is alert and oriented to person, place, and time.  Psychiatric:        Mood and Affect: Mood normal.        Behavior: Behavior normal.      UC Treatments / Results  Labs (all labs ordered are listed, but only abnormal results are displayed) Labs Reviewed  CULTURE, GROUP A STREP ([facility])  SARS CORONAVIRUS 2 (TAT 6-24 HRS)  POCT RAPID STREP A (OFFICE)    EKG   Radiology No results found.  Procedures Procedures (including critical care time)  Medications Ordered in UC Medications  acetaminophen (TYLENOL) tablet 650 mg (650 mg Oral Given 11/24/22 0855)    Initial Impression / Assessment and Plan / UC Course  I have reviewed the triage vital signs and the nursing notes.  Pertinent labs & imaging results that  were available during my care of the patient were reviewed by me and considered in my medical decision making (see chart for details).     Patient given Tylenol in clinic for throat pain Negative rapid strep, will culture COVID PCR and will contact  if positive Lidocaine gargle as needed Rest and fluids OTC analgesics as needed, salt water gargles and warm liquids PCP follow-up if symptoms do not improve ER precautions reviewed Final Clinical Impressions(s) / UC Diagnoses   Final diagnoses:  Sore throat  Viral illness     Discharge Instructions      Your strep swab was negative for strep throat in the clinic.  The clinical symptoms for culture and contact if positive.  Will also contact you if your COVID PCR is positive as well Lidocaine gargle as needed.  Gargle and spit do not swallow You may also do salt water gargles and warm liquids such as tea and honey Over-the-counter Tylenol ibuprofen as needed Please follow-up with your PCP if your symptoms do not improve Please go to the ER if you develop any worsening symptoms I hope you feel better soon!     ED Prescriptions     Medication Sig Dispense Auth. Provider   lidocaine (XYLOCAINE) 2 % solution Use as directed 15 mLs in the mouth or throat every 4 (four) hours as needed (sore throat). Gargle and spit do not swallow 100 mL Radford Pax, NP      PDMP not reviewed this encounter.   Radford Pax, NP 11/24/22 [phone]

## 2022-11-24 NOTE — ED Triage Notes (Signed)
Pt presents with c/o sore throat X 2 days. Pt presents with loss of voice.   Home interventions: robitussin has not given relief.

## 2022-11-25 LAB — SARS CORONAVIRUS 2 (TAT 6-24 HRS): SARS Coronavirus 2: NEGATIVE

## 2022-11-26 ENCOUNTER — Other Ambulatory Visit (HOSPITAL_COMMUNITY): Payer: Self-pay

## 2022-11-26 DIAGNOSIS — K219 Gastro-esophageal reflux disease without esophagitis: Secondary | ICD-10-CM | POA: Insufficient documentation

## 2022-11-26 MED ORDER — DEXAMETHASONE 2 MG PO TABS
10.0000 mg | ORAL_TABLET | Freq: Once | ORAL | 0 refills | Status: AC
  Filled 2022-11-26: qty 5, 1d supply, fill #0

## 2022-11-27 LAB — CULTURE, GROUP A STREP (THRC)

## 2022-11-30 ENCOUNTER — Telehealth (HOSPITAL_COMMUNITY): Payer: Self-pay | Admitting: Emergency Medicine

## 2022-11-30 ENCOUNTER — Other Ambulatory Visit (HOSPITAL_COMMUNITY): Payer: Self-pay

## 2022-11-30 MED ORDER — AMOXICILLIN 500 MG PO CAPS
500.0000 mg | ORAL_CAPSULE | Freq: Two times a day (BID) | ORAL | 0 refills | Status: AC
  Filled 2022-11-30: qty 20, 10d supply, fill #0

## 2023-06-29 DIAGNOSIS — Z800 Family history of malignant neoplasm of digestive organs: Secondary | ICD-10-CM | POA: Insufficient documentation

## 2023-06-29 DIAGNOSIS — K5730 Diverticulosis of large intestine without perforation or abscess without bleeding: Secondary | ICD-10-CM | POA: Insufficient documentation

## 2023-06-30 ENCOUNTER — Ambulatory Visit: Payer: Commercial Managed Care - PPO | Admitting: Family Medicine

## 2023-06-30 ENCOUNTER — Encounter: Payer: Self-pay | Admitting: Family Medicine

## 2023-06-30 VITALS — BP 132/80 | HR 84 | Temp 98.7°F | Ht 61.22 in | Wt 238.4 lb

## 2023-06-30 DIAGNOSIS — Z7689 Persons encountering health services in other specified circumstances: Secondary | ICD-10-CM

## 2023-06-30 DIAGNOSIS — D509 Iron deficiency anemia, unspecified: Secondary | ICD-10-CM

## 2023-06-30 DIAGNOSIS — E559 Vitamin D deficiency, unspecified: Secondary | ICD-10-CM | POA: Diagnosis not present

## 2023-06-30 DIAGNOSIS — R0683 Snoring: Secondary | ICD-10-CM

## 2023-06-30 DIAGNOSIS — J302 Other seasonal allergic rhinitis: Secondary | ICD-10-CM | POA: Diagnosis not present

## 2023-06-30 DIAGNOSIS — Z23 Encounter for immunization: Secondary | ICD-10-CM

## 2023-06-30 DIAGNOSIS — Z6841 Body Mass Index (BMI) 40.0 and over, adult: Secondary | ICD-10-CM

## 2023-06-30 DIAGNOSIS — Z1231 Encounter for screening mammogram for malignant neoplasm of breast: Secondary | ICD-10-CM

## 2023-06-30 DIAGNOSIS — E66813 Obesity, class 3: Secondary | ICD-10-CM

## 2023-06-30 DIAGNOSIS — K219 Gastro-esophageal reflux disease without esophagitis: Secondary | ICD-10-CM | POA: Diagnosis not present

## 2023-06-30 NOTE — Progress Notes (Signed)
New Patient Office Visit   Subjective  Patient ID: [name], female    DOB: 09/04/1970  Age: 52 y.o. MRN: [id number]  Chief Complaint  Patient presents with   New Patient (Initial Visit)    Patient is a 52 year old female seen to establish care and follow-up on chronic conditions.  Patient previously seen at [facility] by Marianne Sofia.  GERD: Taking omeprazole daily times years.  States does not take medication on the weekends but diet is no different from that during weekdays.  Red tomato based sauces, spicy foods, orange juice, and sometimes alcohol cause symptoms.  Patient endorses cooking at home.  Patient drinks water.  No sodas, juice, coffee, tea.  Snoring: Patient endorses waking up feeling unrested.  Could take naps during the day.  Typically sleeps on her stomach.  Has never had a sleep study.  Vitamin D deficiency: Taking OTC vitamin D supplement daily  Iron deficiency: Patient unsure as does not recall being told she had iron deficiency just that she needed to take over the counter iron daily.  Seasonal allergies: May take OTC antihistamine as needed.  States all of them cause drowsiness.  Cough: Patient notes slightly productive cough with postnasal drainage x 4 days.  Denies other symptoms such as headache, ear pain/pressure, facial pain/pressure, rhinorrhea, sore throat.  Cough is about the same throughout the day.  Allergies: NKDA  Social history: Patient is single.  Patient has 1 child.  She currently works for [employer].  Patient endorses social alcohol use.  Patient denies tobacco and drug use.  LMP 06/20/2023  Family medical history: Mom-deceased, colon cancer at age 93 Dad-deceased, DM 2, HTN, kidney disease on HD Sister-deceased, COPD, DM2, kidney disease    Patient Active Problem List   Diagnosis Date Noted   Diverticular disease of colon 06/29/2023   Family history of malignant neoplasm of digestive organs 06/29/2023    Gastro-esophageal reflux disease without esophagitis 11/26/2022   Mixed hyperlipidemia 08/23/2019   Iron deficiency anemia 04/22/2018   Sciatic neuritis 09/27/2012   Obesity, unspecified 09/27/2012   Past Medical History:  Diagnosis Date   GERD (gastroesophageal reflux disease)    Hyperlipidemia    Iron deficiency anemia    Obesity    Past Surgical History:  Procedure Laterality Date   CHOLECYSTECTOMY     TUBAL LIGATION     Social History   Tobacco Use   Smoking status: Never   Smokeless tobacco: Never  Vaping Use   Vaping status: Never Used  Substance Use Topics   Alcohol use: No   Drug use: No   Family History  Problem Relation Age of Onset   Colon cancer Mother    Multiple sclerosis Father    Diabetes Father    Hypertension Father    Sarcoidosis Sister    Diabetes Sister    Hypertension Sister    Healthy Son    Breast cancer Neg Hx    No Known Allergies    ROS Negative unless stated above    Objective:     BP (!) 142/78 (BP Location: Right Arm, Patient Position: Sitting, Cuff Size: Large)   Pulse 84   Temp 98.7 F (37.1 C) (Oral)   Ht 5' 1.22" (1.555 m)   Wt 238 lb 6.4 oz (108.1 kg)   LMP 06/22/2023 (Exact Date)   SpO2 99%   BMI 44.72 kg/m  BP Readings from Last 3 Encounters:  06/30/23 (!) 142/78  11/24/22 113/76  06/23/22 129/84   Wt Readings from Last 3 Encounters:  06/30/23 238 lb 6.4 oz (108.1 kg)  03/11/21 189 lb 12.8 oz (86.1 kg)  02/10/21 196 lb 12.8 oz (89.3 kg)      Physical Exam Constitutional:      General: She is not in acute distress.    Appearance: Normal appearance.  HENT:     Head: Normocephalic and atraumatic.     Nose: Nose normal.     Mouth/Throat:     Mouth: Mucous membranes are moist.     Tonsils: 2+ on the right. 2+ on the left.  Cardiovascular:     Rate and Rhythm: Normal rate and regular rhythm.     Heart sounds: Normal heart sounds. No murmur heard.    No gallop.  Pulmonary:     Effort: Pulmonary  effort is normal. No respiratory distress.     Breath sounds: Normal breath sounds. No wheezing, rhonchi or rales.  Skin:    General: Skin is warm and dry.  Neurological:     Mental Status: She is alert and oriented to person, place, and time.      No results found for any visits on 06/30/23.    Assessment & Plan:  Gastroesophageal reflux disease, unspecified whether esophagitis present -Avoid foods known to cause problems -Continue omeprazole.  Consider stopping omeprazole as patient without symptoms on weekends. -For continued or worsening symptoms GI referral for EGD  Encounter to establish care -We reviewed the PMH, PSH, FH, SH, Meds and Allergies. -We provided refills for any medications we will prescribe as needed. -We addressed current concerns per orders and patient instructions. -We have asked for records for pertinent exams, studies, vaccines and notes from previous providers. -We have advised patient to follow up per instructions below.  Iron deficiency anemia, unspecified iron deficiency anemia type -Continue OTC iron supplement daily -Recheck CBC and iron levels at next OFV  Need for shingles vaccine -     Varicella-zoster vaccine IM  Encounter for screening mammogram for malignant neoplasm of breast -     MM 3D DIAGNOSTIC MAMMOGRAM BILATERAL BREAST; Future  Vitamin D deficiency -Continue OTC daily supplement  Snoring -Concern for sleep apnea -     Ambulatory referral to Sleep Studies  Class 3 severe obesity with serious comorbidity and body mass index (BMI) of 40.0 to 44.9 in adult, unspecified obesity type (HCC) -Body mass index is 44.72 kg/m. -Lifestyle modification strongly encouraged -Sleep study to evaluate for possible OSA -     Ambulatory referral to Sleep Studies  Seasonal allergies -Likely contributing to current intermittent cough and drainage -OTC antihistamine as needed   Return if symptoms worsen or fail to improve.   Deeann Saint, MD

## 2023-06-30 NOTE — Patient Instructions (Signed)
It was nice meeting you today.  You can schedule an appointment for your physical at your convenience.

## 2023-07-27 ENCOUNTER — Ambulatory Visit
Admission: RE | Admit: 2023-07-27 | Discharge: 2023-07-27 | Disposition: A | Discharge status: Home / Self Care | Payer: Commercial Managed Care - PPO | Source: Ambulatory Visit | Attending: Family Medicine | Admitting: Family Medicine

## 2023-07-27 DIAGNOSIS — Z1231 Encounter for screening mammogram for malignant neoplasm of breast: Secondary | ICD-10-CM

## 2023-07-28 ENCOUNTER — Ambulatory Visit (INDEPENDENT_AMBULATORY_CARE_PROVIDER_SITE_OTHER): Payer: Commercial Managed Care - PPO | Admitting: Family Medicine

## 2023-07-28 ENCOUNTER — Other Ambulatory Visit (HOSPITAL_COMMUNITY)
Admission: RE | Admit: 2023-07-28 | Discharge: 2023-07-28 | Disposition: A | Discharge status: Home / Self Care | Payer: Commercial Managed Care - PPO | Source: Ambulatory Visit | Attending: Family Medicine | Admitting: Family Medicine

## 2023-07-28 ENCOUNTER — Encounter: Payer: Self-pay | Admitting: Family Medicine

## 2023-07-28 VITALS — BP 136/80 | HR 86 | Temp 98.0°F | Ht 61.2 in | Wt 236.8 lb

## 2023-07-28 DIAGNOSIS — Z0000 Encounter for general adult medical examination without abnormal findings: Secondary | ICD-10-CM

## 2023-07-28 DIAGNOSIS — Z6841 Body Mass Index (BMI) 40.0 and over, adult: Secondary | ICD-10-CM | POA: Diagnosis not present

## 2023-07-28 DIAGNOSIS — Z124 Encounter for screening for malignant neoplasm of cervix: Secondary | ICD-10-CM | POA: Diagnosis not present

## 2023-07-28 DIAGNOSIS — E559 Vitamin D deficiency, unspecified: Secondary | ICD-10-CM

## 2023-07-28 DIAGNOSIS — D509 Iron deficiency anemia, unspecified: Secondary | ICD-10-CM

## 2023-07-28 DIAGNOSIS — E782 Mixed hyperlipidemia: Secondary | ICD-10-CM | POA: Diagnosis not present

## 2023-07-28 DIAGNOSIS — E66813 Obesity, class 3: Secondary | ICD-10-CM | POA: Diagnosis not present

## 2023-07-28 DIAGNOSIS — N898 Other specified noninflammatory disorders of vagina: Secondary | ICD-10-CM | POA: Diagnosis not present

## 2023-07-28 NOTE — Progress Notes (Signed)
 Established Patient Office Visit   Subjective  Patient ID: [name], female    DOB: 1971/04/25  Age: 53 y.o. MRN: [id number]  Chief Complaint  Patient presents with   Annual Exam    Not fasting    Gynecologic Exam    Patient is a 53 year old female seen for CPE.  Patient states she is doing well overall and is without acute concern.  Had mammogram done yesterday.    Patient Active Problem List   Diagnosis Date Noted   Diverticular disease of colon 06/29/2023   Family history of malignant neoplasm of digestive organs 06/29/2023   Gastro-esophageal reflux disease without esophagitis 11/26/2022   Mixed hyperlipidemia 08/23/2019   Iron deficiency anemia 04/22/2018   Sciatic neuritis 09/27/2012   Obesity, unspecified 09/27/2012   Past Medical History:  Diagnosis Date   GERD (gastroesophageal reflux disease)    Hyperlipidemia    Iron deficiency anemia    Obesity    Past Surgical History:  Procedure Laterality Date   CHOLECYSTECTOMY     TUBAL LIGATION     Social History   Tobacco Use   Smoking status: Never   Smokeless tobacco: Never  Vaping Use   Vaping status: Never Used  Substance Use Topics   Alcohol use: No   Drug use: No   Family History  Problem Relation Age of Onset   Colon cancer Mother    Multiple sclerosis Father    Diabetes Father    Hypertension Father    Sarcoidosis Sister    Diabetes Sister    Hypertension Sister    Healthy Son    Breast cancer Neg Hx    No Known Allergies    ROS Negative unless stated above    Objective:     BP 136/80 (BP Location: Left Arm, Patient Position: Sitting, Cuff Size: Large)   Pulse 86   Temp 98 F (36.7 C) (Oral)   Ht 5' 1.2 (1.554 m)   Wt 236 lb 12.8 oz (107.4 kg)   LMP 06/22/2023 (Exact Date)   SpO2 98%   BMI 44.45 kg/m  BP Readings from Last 3 Encounters:  07/28/23 136/80  06/30/23 132/80  11/24/22 113/76   Wt Readings from Last 3 Encounters:  07/28/23 236 lb 12.8 oz (107.4 kg)   06/30/23 238 lb 6.4 oz (108.1 kg)  03/11/21 189 lb 12.8 oz (86.1 kg)      Physical Exam Constitutional:      Appearance: Normal appearance.  HENT:     Head: Normocephalic and atraumatic.     Right Ear: Tympanic membrane, ear canal and external ear normal.     Left Ear: Tympanic membrane, ear canal and external ear normal.     Nose: Nose normal.     Mouth/Throat:     Mouth: Mucous membranes are moist.     Pharynx: No oropharyngeal exudate or posterior oropharyngeal erythema.  Eyes:     General: No scleral icterus.    Extraocular Movements: Extraocular movements intact.     Conjunctiva/sclera: Conjunctivae normal.     Pupils: Pupils are equal, round, and reactive to light.  Neck:     Thyroid : No thyromegaly.  Cardiovascular:     Rate and Rhythm: Normal rate and regular rhythm.     Pulses: Normal pulses.     Heart sounds: Normal heart sounds. No murmur heard.    No friction rub.  Pulmonary:     Effort: Pulmonary effort is normal.     Breath sounds: Normal  breath sounds. No wheezing, rhonchi or rales.  Abdominal:     General: Bowel sounds are normal.     Palpations: Abdomen is soft.     Tenderness: There is no abdominal tenderness.  Genitourinary:    General: Normal vulva.     Vagina: Vaginal discharge present.     Cervix: Discharge present. No cervical motion tenderness or erythema.     Rectum: Normal.  Musculoskeletal:        General: No deformity. Normal range of motion.  Lymphadenopathy:     Cervical: No cervical adenopathy.  Skin:    General: Skin is warm and dry.     Findings: No lesion.  Neurological:     General: No focal deficit present.     Mental Status: She is alert and oriented to person, place, and time.  Psychiatric:        Mood and Affect: Mood normal.        Thought Content: Thought content normal.       07/28/2023    4:48 PM 06/30/2023    9:09 AM 02/10/2021    4:58 PM 10/04/2019    2:46 PM 05/22/2019    3:00 PM  Depression screen PHQ 2/9   Decreased Interest 0 0 0 0 0  Down, Depressed, Hopeless 0 0 0 0 0  PHQ - 2 Score 0 0 0 0 0  Altered sleeping 0 0     Tired, decreased energy 0 1     Change in appetite 0 0     Feeling bad or failure about yourself  0 0     Trouble concentrating 0 0     Moving slowly or fidgety/restless 0 0     Suicidal thoughts 0 0     PHQ-9 Score 0 1     Difficult doing work/chores Not difficult at all Not difficult at all          07/28/2023    4:48 PM 06/30/2023    9:09 AM  GAD 7 : Generalized Anxiety Score  Nervous, Anxious, on Edge 0 0  Control/stop worrying 0 0  Worry too much - different things 0 0  Trouble relaxing 0 0  Restless 0 0  Easily annoyed or irritable 0 0  Afraid - awful might happen 0 0  Total GAD 7 Score 0 0  Anxiety Difficulty Not difficult at all       No results found for any visits on 07/28/23.    Assessment & Plan:  Well adult exam -Age-appropriate health screenings discussed -Obtain labs -Pap done this visit -Immunizations reviewed and up-to-date. -Colonoscopy done 11/19/21 -Mammogram done 07/27/23 -Next CPE in 1 year -     CBC with Differential/Platelet; Future -     Comprehensive metabolic panel; Future -     Hemoglobin A1c; Future -     Lipid panel; Future -     TSH; Future -     T4, free; Future  Vitamin D  deficiency -     VITAMIN D  25 Hydroxy (Vit-D Deficiency, Fractures); Future  Iron deficiency anemia, unspecified iron deficiency anemia type -Restart iron supplements if needed based on lab results -     CBC with Differential/Platelet; Future -     Iron, TIBC and Ferritin Panel; Future  Class 3 severe obesity with serious comorbidity and body mass index (BMI) of 40.0 to 44.9 in adult, unspecified obesity type (HCC) -Body mass index is 44.45 kg/m. -l lifestyle modifications encouraged -  Comprehensive metabolic panel; Future -     Hemoglobin A1c; Future -     Lipid panel; Future -     TSH; Future -     VITAMIN D  25 Hydroxy (Vit-D  Deficiency, Fractures); Future -     T4, free; Future  Mixed hyperlipidemia -     Comprehensive metabolic panel; Future -     Lipid panel; Future  Cervical cancer screening -     Cytology - PAP  Vaginal discharge    Return in about 1 year (around 07/27/2024), or if symptoms worsen or fail to improve, for physical.   [name] Single, MD

## 2023-07-29 ENCOUNTER — Other Ambulatory Visit: Payer: Self-pay | Admitting: Family Medicine

## 2023-07-29 DIAGNOSIS — R928 Other abnormal and inconclusive findings on diagnostic imaging of breast: Secondary | ICD-10-CM

## 2023-07-29 LAB — CBC WITH DIFFERENTIAL/PLATELET
Basophils Absolute: 0.1 10*3/uL (ref 0.0–0.1)
Basophils Relative: 1.3 % (ref 0.0–3.0)
Eosinophils Absolute: 0.4 10*3/uL (ref 0.0–0.7)
Eosinophils Relative: 6.7 % — ABNORMAL HIGH (ref 0.0–5.0)
HCT: 37.9 % (ref 36.0–46.0)
Hemoglobin: 12.3 g/dL (ref 12.0–15.0)
Lymphocytes Relative: 43.8 % (ref 12.0–46.0)
Lymphs Abs: 2.4 10*3/uL (ref 0.7–4.0)
MCHC: 32.4 g/dL (ref 30.0–36.0)
MCV: 97.3 fL (ref 78.0–100.0)
Monocytes Absolute: 0.5 10*3/uL (ref 0.1–1.0)
Monocytes Relative: 9.9 % (ref 3.0–12.0)
Neutro Abs: 2.1 10*3/uL (ref 1.4–7.7)
Neutrophils Relative %: 38.3 % — ABNORMAL LOW (ref 43.0–77.0)
Platelets: 392 10*3/uL (ref 150.0–400.0)
RBC: 3.89 Mil/uL (ref 3.87–5.11)
RDW: 14.2 % (ref 11.5–15.5)
WBC: 5.5 10*3/uL (ref 4.0–10.5)

## 2023-07-29 LAB — VITAMIN D 25 HYDROXY (VIT D DEFICIENCY, FRACTURES): VITD: 43.46 ng/mL (ref 30.00–100.00)

## 2023-07-29 LAB — COMPREHENSIVE METABOLIC PANEL
ALT: 10 U/L (ref 0–35)
AST: 11 U/L (ref 0–37)
Albumin: 3.5 g/dL (ref 3.5–5.2)
Alkaline Phosphatase: 55 U/L (ref 39–117)
BUN: 10 mg/dL (ref 6–23)
CO2: 30 meq/L (ref 19–32)
Calcium: 9.3 mg/dL (ref 8.4–10.5)
Chloride: 103 meq/L (ref 96–112)
Creatinine, Ser: 0.62 mg/dL (ref 0.40–1.20)
GFR: 103.43 mL/min (ref >60.00)
Glucose, Bld: 91 mg/dL (ref 70–99)
Potassium: 4.6 meq/L (ref 3.5–5.1)
Sodium: 139 meq/L (ref 135–145)
Total Bilirubin: 0.3 mg/dL (ref 0.2–1.2)
Total Protein: 8.2 g/dL (ref 6.0–8.3)

## 2023-07-29 LAB — LIPID PANEL
Cholesterol: 127 mg/dL (ref 0–200)
HDL: 41.7 mg/dL (ref >39.00)
LDL Cholesterol: 53 mg/dL (ref 0–99)
NonHDL: 85.75
Total CHOL/HDL Ratio: 3
Triglycerides: 164 mg/dL — ABNORMAL HIGH (ref 0.0–149.0)
VLDL: 32.8 mg/dL (ref 0.0–40.0)

## 2023-07-29 LAB — T4, FREE: Free T4: 0.86 ng/dL (ref 0.60–1.60)

## 2023-07-29 LAB — HEMOGLOBIN A1C: Hgb A1c MFr Bld: 6.2 % (ref 4.6–6.5)

## 2023-07-29 LAB — IRON,TIBC AND FERRITIN PANEL
%SAT: 21 % (ref 16–45)
Ferritin: 24 ng/mL (ref 16–232)
Iron: 63 ug/dL (ref 45–160)
TIBC: 305 ug/dL (ref 250–450)

## 2023-07-29 LAB — TSH: TSH: 2.22 u[IU]/mL (ref 0.35–5.50)

## 2023-08-02 LAB — CYTOLOGY - PAP
Adequacy: ABSENT
Chlamydia: NEGATIVE
Comment: NEGATIVE
Comment: NEGATIVE
Comment: NEGATIVE
Comment: NORMAL
Diagnosis: NEGATIVE
High risk HPV: NEGATIVE
Neisseria Gonorrhea: NEGATIVE
Trichomonas: NEGATIVE

## 2023-08-11 ENCOUNTER — Institutional Professional Consult (permissible substitution): Payer: Commercial Managed Care - PPO | Admitting: Neurology

## 2023-08-11 ENCOUNTER — Telehealth: Payer: Self-pay | Admitting: Neurology

## 2023-08-11 NOTE — Telephone Encounter (Signed)
Pt cx appt. Will call back to r/s

## 2023-08-16 ENCOUNTER — Other Ambulatory Visit: Payer: Commercial Managed Care - PPO

## 2023-09-05 ENCOUNTER — Ambulatory Visit
Admission: RE | Admit: 2023-09-05 | Discharge: 2023-09-05 | Disposition: A | Discharge status: Home / Self Care | Payer: Commercial Managed Care - PPO | Source: Ambulatory Visit | Attending: Family Medicine

## 2023-09-05 ENCOUNTER — Other Ambulatory Visit: Payer: Self-pay | Admitting: Family Medicine

## 2023-09-05 DIAGNOSIS — R92321 Mammographic fibroglandular density, right breast: Secondary | ICD-10-CM | POA: Diagnosis not present

## 2023-09-05 DIAGNOSIS — R590 Localized enlarged lymph nodes: Secondary | ICD-10-CM | POA: Diagnosis not present

## 2023-09-05 DIAGNOSIS — R928 Other abnormal and inconclusive findings on diagnostic imaging of breast: Secondary | ICD-10-CM

## 2023-09-05 DIAGNOSIS — N6315 Unspecified lump in the right breast, overlapping quadrants: Secondary | ICD-10-CM | POA: Diagnosis not present

## 2023-09-05 DIAGNOSIS — N6310 Unspecified lump in the right breast, unspecified quadrant: Secondary | ICD-10-CM

## 2023-09-05 DIAGNOSIS — R599 Enlarged lymph nodes, unspecified: Secondary | ICD-10-CM

## 2023-10-15 DIAGNOSIS — H5203 Hypermetropia, bilateral: Secondary | ICD-10-CM | POA: Diagnosis not present

## 2023-10-15 DIAGNOSIS — H524 Presbyopia: Secondary | ICD-10-CM | POA: Diagnosis not present

## 2023-12-15 ENCOUNTER — Ambulatory Visit
Admission: RE | Admit: 2023-12-15 | Discharge: 2023-12-15 | Disposition: A | Discharge status: Home / Self Care | Source: Ambulatory Visit | Attending: Family Medicine

## 2023-12-15 ENCOUNTER — Other Ambulatory Visit: Payer: Self-pay | Admitting: Family Medicine

## 2023-12-15 DIAGNOSIS — R599 Enlarged lymph nodes, unspecified: Secondary | ICD-10-CM

## 2023-12-15 DIAGNOSIS — N6310 Unspecified lump in the right breast, unspecified quadrant: Secondary | ICD-10-CM

## 2023-12-15 DIAGNOSIS — N6001 Solitary cyst of right breast: Secondary | ICD-10-CM | POA: Diagnosis not present

## 2023-12-15 DIAGNOSIS — D360 Benign neoplasm of lymph nodes: Secondary | ICD-10-CM | POA: Diagnosis not present

## 2024-02-13 ENCOUNTER — Ambulatory Visit
Admission: EM | Admit: 2024-02-13 | Discharge: 2024-02-13 | Disposition: A | Discharge status: Home / Self Care | Attending: Family Medicine | Admitting: Family Medicine

## 2024-02-13 ENCOUNTER — Other Ambulatory Visit (HOSPITAL_COMMUNITY): Payer: Self-pay

## 2024-02-13 DIAGNOSIS — J029 Acute pharyngitis, unspecified: Secondary | ICD-10-CM

## 2024-02-13 MED ORDER — AMOXICILLIN 500 MG PO CAPS
500.0000 mg | ORAL_CAPSULE | Freq: Two times a day (BID) | ORAL | 0 refills | Status: DC
  Filled 2024-02-13: qty 20, 10d supply, fill #0

## 2024-02-13 NOTE — ED Triage Notes (Signed)
Pt c/o sore throat x 5 days-NAD-steady gait 

## 2024-02-13 NOTE — ED Provider Notes (Signed)
 [facility]  Note:  This document was prepared using Conservation officer, historic buildings and may include unintentional dictation errors.  MRN: [id number] DOB: 01/31/1971  Subjective:   [name] is a 53 y.o. female presenting for 5 day history of subjective fever, throat pain, painful swallowing, mild left ear discomfort. No runny or stuffy nose, chest pain, shob, wheezing, rashes, n/v, belly pain, changes to bowel or urinary habits. No smoking of any kind including cigarettes, cigars, vaping, marijuana use.    No current facility-administered medications for this encounter.  Current Outpatient Medications:    albuterol  (VENTOLIN  HFA) 108 (90 Base) MCG/ACT inhaler, Inhale 2 puffs into the lungs every 6 (six) hours as needed for wheezing or shortness of breath (Cough). (Patient not taking: Reported on 07/28/2023), Disp: 13.4 g, Rfl: 2   cetirizine  (ZYRTEC  ALLERGY) 10 MG tablet, Take 1 tablet (10 mg total) by mouth at bedtime., Disp: 90 tablet, Rfl: 1   ferrous sulfate  325 (65 FE) MG EC tablet, Take 325 mg by mouth 3 (three) times daily with meals., Disp: , Rfl:    VITAMIN D  PO, Take 1 capsule by mouth daily. 5,000 units, Disp: , Rfl:    No Known Allergies  Past Medical History:  Diagnosis Date   GERD (gastroesophageal reflux disease)    Hyperlipidemia    Iron deficiency anemia    Obesity      Past Surgical History:  Procedure Laterality Date   CHOLECYSTECTOMY     TUBAL LIGATION      Family History  Problem Relation Age of Onset   Colon cancer Mother    Multiple sclerosis Father    Diabetes Father    Hypertension Father    Sarcoidosis Sister    Diabetes Sister    Hypertension Sister    Healthy Son    Breast cancer Neg Hx     Social History   Tobacco Use   Smoking status: Never   Smokeless tobacco: Never  Vaping Use   Vaping status: Never Used  Substance Use Topics   Alcohol use: No   Drug use: No    ROS   Objective:    Vitals: BP 127/81 (BP Location: Right Arm)   Pulse (!) 101   Temp 99.6 F (37.6 C) (Oral)   Resp 16   LMP 12/25/2023 (Approximate)   SpO2 95%   Physical Exam Constitutional:      General: She is not in acute distress.    Appearance: Normal appearance. She is well-developed. She is not ill-appearing, toxic-appearing or diaphoretic.  HENT:     Head: Normocephalic and atraumatic.     Nose: Nose normal.     Mouth/Throat:     Mouth: Mucous membranes are moist.     Pharynx: Pharyngeal swelling and posterior oropharyngeal erythema present. No oropharyngeal exudate or uvula swelling.     Tonsils: No tonsillar exudate or tonsillar abscesses. 0 on the right. 2+ on the left.  Eyes:     General: No scleral icterus.       Right eye: No discharge.        Left eye: No discharge.     Extraocular Movements: Extraocular movements intact.  Cardiovascular:     Rate and Rhythm: Normal rate.  Pulmonary:     Effort: Pulmonary effort is normal.  Skin:    General: Skin is warm and dry.  Neurological:     General: No focal deficit present.     Mental Status: She is  alert and oriented to person, place, and time.  Psychiatric:        Mood and Affect: Mood normal.        Behavior: Behavior normal.     Assessment and Plan :   PDMP not reviewed this encounter.  1. Acute pharyngitis, unspecified etiology    Will treat empirically for pharyngitis given physical exam findings.  Patient is to start amoxicillin , use supportive care otherwise. Counseled patient on potential for adverse effects with medications prescribed/recommended today, ER and return-to-clinic precautions discussed, patient verbalized understanding.    Christopher Savannah, [place] 02/13/24 [phone]

## 2024-07-03 ENCOUNTER — Ambulatory Visit
Admission: EM | Admit: 2024-07-03 | Discharge: 2024-07-03 | Disposition: A | Discharge status: Home / Self Care | Attending: Family Medicine | Admitting: Family Medicine

## 2024-07-03 ENCOUNTER — Ambulatory Visit (INDEPENDENT_AMBULATORY_CARE_PROVIDER_SITE_OTHER)

## 2024-07-03 ENCOUNTER — Other Ambulatory Visit (HOSPITAL_COMMUNITY): Payer: Self-pay

## 2024-07-03 DIAGNOSIS — R1084 Generalized abdominal pain: Secondary | ICD-10-CM

## 2024-07-03 DIAGNOSIS — J00 Acute nasopharyngitis [common cold]: Secondary | ICD-10-CM | POA: Diagnosis not present

## 2024-07-03 DIAGNOSIS — R109 Unspecified abdominal pain: Secondary | ICD-10-CM | POA: Diagnosis not present

## 2024-07-03 DIAGNOSIS — K5792 Diverticulitis of intestine, part unspecified, without perforation or abscess without bleeding: Secondary | ICD-10-CM

## 2024-07-03 DIAGNOSIS — K5900 Constipation, unspecified: Secondary | ICD-10-CM | POA: Diagnosis not present

## 2024-07-03 DIAGNOSIS — Z9049 Acquired absence of other specified parts of digestive tract: Secondary | ICD-10-CM | POA: Diagnosis not present

## 2024-07-03 LAB — POCT URINE DIPSTICK
Bilirubin, UA: NEGATIVE
Glucose, UA: NEGATIVE mg/dL
Ketones, POC UA: NEGATIVE mg/dL
Leukocytes, UA: NEGATIVE
Nitrite, UA: NEGATIVE
Protein Ur, POC: NEGATIVE mg/dL
Spec Grav, UA: 1.01 (ref 1.010–1.025)
Urobilinogen, UA: 1 U/dL
pH, UA: 5.5 (ref 5.0–8.0)

## 2024-07-03 MED ORDER — AMOXICILLIN-POT CLAVULANATE 875-125 MG PO TABS
1.0000 | ORAL_TABLET | Freq: Two times a day (BID) | ORAL | 0 refills | Status: DC
  Filled 2024-07-03: qty 20, 10d supply, fill #0

## 2024-07-03 NOTE — ED Provider Notes (Signed)
 [facility]  Note:  This document was prepared using Conservation officer, historic buildings and may include unintentional dictation errors.  MRN: [id number] DOB: [birth year]  Subjective:   [name] is a 53 y.o. female presenting for 3-day history of persistent left lower abdominal pain, mid lower abdominal pain. Has a history of diverticulitis. Last episode was 2022.  Is worried that this is a recurrence.  Has not had a bowel movement in the past 3 days.  Has had decreased appetite.  No fever, nausea, vomiting, diarrhea, bloody stools.  She has also developed runny and stuffy nose, sinus pain, some streaks of blood with her mucus from her nose.  Has had some right ear fullness.  No chest pain, shortness of breath or wheezing.  No asthma.  No smoking of any kind including cigarettes, cigars, vaping, marijuana use.    Current Outpatient Medications  Medication Instructions   albuterol  (VENTOLIN  HFA) 108 (90 Base) MCG/ACT inhaler 2 puffs, Inhalation, Every 6 hours PRN   amoxicillin  (AMOXIL ) 500 mg, Oral, 2 times daily   cetirizine  (ZYRTEC  ALLERGY) 10 mg, Oral, Daily at bedtime   ferrous sulfate  325 mg, 3 times daily with meals   VITAMIN D  PO 1 capsule, Daily    Allergies[1]  Past Medical History:  Diagnosis Date   GERD (gastroesophageal reflux disease)    Hyperlipidemia    Iron deficiency anemia    Obesity      Past Surgical History:  Procedure Laterality Date   CHOLECYSTECTOMY     TUBAL LIGATION      Family History  Problem Relation Age of Onset   Colon cancer Mother    Multiple sclerosis Father    Diabetes Father    Hypertension Father    Sarcoidosis Sister    Diabetes Sister    Hypertension Sister    Healthy Son    Breast cancer Neg Hx     Social History   Occupational History   Not on file  Tobacco Use   Smoking status: Never   Smokeless tobacco: Never  Vaping Use   Vaping status: Never Used  Substance and Sexual Activity    Alcohol use: No   Drug use: No   Sexual activity: Not on file    Comment: tubal ligation     ROS   Objective:   Vitals: BP 137/77 (BP Location: Right Arm)   Pulse 86   Temp (!) 97.2 F (36.2 C) (Oral)   Resp 18   LMP  (LMP Unknown)   SpO2 98%   Physical Exam Constitutional:      General: She is not in acute distress.    Appearance: Normal appearance. She is well-developed and normal weight. She is not ill-appearing, toxic-appearing or diaphoretic.  HENT:     Head: Normocephalic and atraumatic.     Right Ear: Tympanic membrane, ear canal and external ear normal. No drainage or tenderness. No middle ear effusion. There is no impacted cerumen. Tympanic membrane is not erythematous or bulging.     Left Ear: Tympanic membrane, ear canal and external ear normal. No drainage or tenderness.  No middle ear effusion. There is no impacted cerumen. Tympanic membrane is not erythematous or bulging.     Nose: Congestion present. No rhinorrhea.     Mouth/Throat:     Mouth: Mucous membranes are moist. No oral lesions.     Pharynx: No pharyngeal swelling, oropharyngeal exudate, posterior oropharyngeal erythema or uvula swelling.     Tonsils:  No tonsillar exudate or tonsillar abscesses.  Eyes:     General: No scleral icterus.       Right eye: No discharge.        Left eye: No discharge.     Extraocular Movements: Extraocular movements intact.     Right eye: Normal extraocular motion.     Left eye: Normal extraocular motion.     Conjunctiva/sclera: Conjunctivae normal.  Cardiovascular:     Rate and Rhythm: Normal rate and regular rhythm.     Heart sounds: Normal heart sounds. No murmur heard.    No friction rub. No gallop.  Pulmonary:     Effort: Pulmonary effort is normal. No respiratory distress.     Breath sounds: No stridor. No wheezing, rhonchi or rales.  Chest:     Chest wall: No tenderness.  Abdominal:     General: Bowel sounds are normal. There is no distension.      Palpations: Abdomen is soft. There is no mass.     Tenderness: There is abdominal tenderness in the periumbilical area and left lower quadrant. There is no right CVA tenderness, left CVA tenderness, guarding or rebound.  Musculoskeletal:     Cervical back: Normal range of motion and neck supple.  Lymphadenopathy:     Cervical: No cervical adenopathy.  Skin:    General: Skin is warm and dry.  Neurological:     General: No focal deficit present.     Mental Status: She is alert and oriented to person, place, and time.  Psychiatric:        Mood and Affect: Mood normal.        Behavior: Behavior normal.        Thought Content: Thought content normal.        Judgment: Judgment normal.     Results for orders placed or performed during the hospital encounter of 07/03/24 (from the past 24 hours)  POCT URINE DIPSTICK     Status: Abnormal   Collection Time: 07/03/24  3:44 PM  Result Value Ref Range   Color, UA yellow yellow   Clarity, UA clear clear   Glucose, UA negative negative mg/dL   Bilirubin, UA negative negative   Ketones, POC UA negative negative mg/dL   Spec Grav, UA 8.989 8.989 - 1.025   Blood, UA trace-intact (A) negative   pH, UA 5.5 5.0 - 8.0   Protein Ur, POC negative negative mg/dL   Urobilinogen, UA 1.0 0.2 or 1.0 E.U./dL   Nitrite, UA Negative Negative   Leukocytes, UA Negative Negative   DG Abd 1 View Result Date: 07/03/2024 CLINICAL DATA:  Abdominal pain and constipation EXAM: ABDOMEN - 1 VIEW COMPARISON:  March 10, 2021 FINDINGS: The bowel gas pattern is normal. Mild amount of stool is seen throughout the colon. Status post cholecystectomy. No radio-opaque calculi or other significant radiographic abnormality are seen. IMPRESSION: No abnormal bowel dilatation. Mild stool burden. Electronically Signed   By: Lynwood Landy Raddle M.D.   On: 07/03/2024 16:00   Assessment and Plan :   PDMP not reviewed this encounter.  1. Acute diverticulitis   2. Generalized abdominal  pain   3. Acute rhinitis      Will manage for acute diverticulitis.  Maintain strict ER precautions.  Start Augmentin .  Use supportive care otherwise for suspected concurrent acute viral rhinitis.  Deferred imaging given clear cardiopulmonary exam, hemodynamically stable vital signs.  Counseled patient on potential for adverse effects with medications prescribed/recommended today, ER and return-to-clinic  precautions discussed, patient verbalized understanding.     [1] No Known Allergies    Christopher Savannah, PA-C 07/03/24 1615

## 2024-07-03 NOTE — ED Triage Notes (Addendum)
 Patient reports LLQ pain and epigastric tenderness for 3 days. Patient denies any nausea, vomiting, or diarrhea. Patient reports constipation, does not remember last BM but it was within the last week. Patient reports a hx of diverticulitis, reports that symptoms feel similar. Patient also reports sinus pressure started 3 days, patient reports that (R) nostril bleeds when blowing nose. Patient took Sudafed yesterday with minimal relief. Patient denies any cough or sore throat. Patient reports most symptoms in nasal area. Patient also reports (R) ear fullness.

## 2024-07-03 NOTE — Discharge Instructions (Addendum)
 Start Augmentin  to help with suspected diverticulitis. If you worsen then go to the emergency room. For sore throat or cough try using a honey-based tea. Use 3 teaspoons of honey with juice squeezed from half lemon. Place shaved pieces of ginger into 1/2-1 cup of water and warm over stove top. Then mix the ingredients and repeat every 4 hours as needed. Please take Tylenol  500mg -650mg  once every 6 hours for fevers, aches and pains. Hydrate very well with at least 2 liters (64 ounces) of water. Eat light meals such as soups (chicken and noodles, chicken wild rice, vegetable).  Do not eat any foods that you are allergic to.  Start an antihistamine like Zyrtec  (10mg  daily) for postnasal drainage, sinus congestion.  You can take this together with pseudoephedrine (Sudafed) at a dose of 30 mg 3 times a day or twice daily as needed for the same kind of congestion.

## 2024-07-16 ENCOUNTER — Other Ambulatory Visit

## 2024-07-16 ENCOUNTER — Encounter

## 2024-07-30 ENCOUNTER — Ambulatory Visit
Admission: RE | Admit: 2024-07-30 | Discharge: 2024-07-30 | Disposition: A | Source: Ambulatory Visit | Attending: Family Medicine

## 2024-07-30 DIAGNOSIS — N6001 Solitary cyst of right breast: Secondary | ICD-10-CM

## 2024-08-02 ENCOUNTER — Encounter: Payer: Self-pay | Admitting: Family Medicine

## 2024-08-02 ENCOUNTER — Ambulatory Visit: Admitting: Family Medicine

## 2024-08-02 VITALS — BP 134/72 | HR 80 | Temp 98.3°F | Ht 61.2 in | Wt 238.6 lb

## 2024-08-02 DIAGNOSIS — E781 Pure hyperglyceridemia: Secondary | ICD-10-CM | POA: Diagnosis not present

## 2024-08-02 DIAGNOSIS — Z0000 Encounter for general adult medical examination without abnormal findings: Secondary | ICD-10-CM | POA: Diagnosis not present

## 2024-08-02 DIAGNOSIS — E66813 Obesity, class 3: Secondary | ICD-10-CM

## 2024-08-02 DIAGNOSIS — Z6841 Body Mass Index (BMI) 40.0 and over, adult: Secondary | ICD-10-CM

## 2024-08-02 LAB — COMPREHENSIVE METABOLIC PANEL WITH GFR
ALT: 9 U/L (ref 3–35)
AST: 12 U/L (ref 5–37)
Albumin: 3.4 g/dL — ABNORMAL LOW (ref 3.5–5.2)
Alkaline Phosphatase: 48 U/L (ref 39–117)
BUN: 11 mg/dL (ref 6–23)
CO2: 28 meq/L (ref 19–32)
Calcium: 8.8 mg/dL (ref 8.4–10.5)
Chloride: 105 meq/L (ref 96–112)
Creatinine, Ser: 0.64 mg/dL (ref 0.40–1.20)
GFR: 101.92 mL/min
Glucose, Bld: 95 mg/dL (ref 70–99)
Potassium: 4.3 meq/L (ref 3.5–5.1)
Sodium: 137 meq/L (ref 135–145)
Total Bilirubin: 0.3 mg/dL (ref 0.2–1.2)
Total Protein: 8.5 g/dL — ABNORMAL HIGH (ref 6.0–8.3)

## 2024-08-02 LAB — CBC WITH DIFFERENTIAL/PLATELET
Basophils Absolute: 0 K/uL (ref 0.0–0.1)
Basophils Relative: 0.7 % (ref 0.0–3.0)
Eosinophils Absolute: 0.2 K/uL (ref 0.0–0.7)
Eosinophils Relative: 5 % (ref 0.0–5.0)
HCT: 35.7 % — ABNORMAL LOW (ref 36.0–46.0)
Hemoglobin: 11.9 g/dL — ABNORMAL LOW (ref 12.0–15.0)
Lymphocytes Relative: 45.1 % (ref 12.0–46.0)
Lymphs Abs: 2.1 K/uL (ref 0.7–4.0)
MCHC: 33.2 g/dL (ref 30.0–36.0)
MCV: 93 fl (ref 78.0–100.0)
Monocytes Absolute: 0.5 K/uL (ref 0.1–1.0)
Monocytes Relative: 10.4 % (ref 3.0–12.0)
Neutro Abs: 1.8 K/uL (ref 1.4–7.7)
Neutrophils Relative %: 38.8 % — ABNORMAL LOW (ref 43.0–77.0)
Platelets: 316 K/uL (ref 150.0–400.0)
RBC: 3.84 Mil/uL — ABNORMAL LOW (ref 3.87–5.11)
RDW: 15.1 % (ref 11.5–15.5)
WBC: 4.6 K/uL (ref 4.0–10.5)

## 2024-08-02 LAB — T4, FREE: Free T4: 0.78 ng/dL (ref 0.60–1.60)

## 2024-08-02 LAB — LIPID PANEL
Cholesterol: 117 mg/dL (ref 28–200)
HDL: 41.6 mg/dL
LDL Cholesterol: 53 mg/dL (ref 10–99)
NonHDL: 75.23
Total CHOL/HDL Ratio: 3
Triglycerides: 110 mg/dL (ref 10.0–149.0)
VLDL: 22 mg/dL (ref 0.0–40.0)

## 2024-08-02 LAB — TSH: TSH: 2.95 u[IU]/mL (ref 0.35–5.50)

## 2024-08-02 LAB — HEMOGLOBIN A1C: Hgb A1c MFr Bld: 6.1 % (ref 4.6–6.5)

## 2024-08-02 NOTE — Progress Notes (Signed)
 "  Established Patient Office Visit   Subjective  Patient ID: [name], female    DOB: Feb 07, 1971  Age: 54 y.o. MRN: [id number]  Chief Complaint  Patient presents with   Annual Exam    Patient is a 54 year old female seen for CPE.  Patient states she is doing well overall.  And is without concern.  Mammogram Tuesday, 07/31/2024.    Patient Active Problem List   Diagnosis Date Noted   Diverticular disease of colon 06/29/2023   Family history of malignant neoplasm of digestive organs 06/29/2023   Gastro-esophageal reflux disease without esophagitis 11/26/2022   Mixed hyperlipidemia 08/23/2019   Iron deficiency anemia 04/22/2018   Sciatic neuritis 09/27/2012   Obesity, unspecified 09/27/2012   Past Medical History:  Diagnosis Date   GERD (gastroesophageal reflux disease)    Hyperlipidemia    Iron deficiency anemia    Obesity    Past Surgical History:  Procedure Laterality Date   CHOLECYSTECTOMY     TUBAL LIGATION     Social History[1] Family History  Problem Relation Age of Onset   Colon cancer Mother    Multiple sclerosis Father    Diabetes Father    Hypertension Father    Sarcoidosis Sister    Diabetes Sister    Hypertension Sister    Healthy Son    Breast cancer Neg Hx    Allergies[2]  ROS Negative unless stated above    Objective:     BP 134/72 (BP Location: Left Arm, Patient Position: Sitting, Cuff Size: Large)   Pulse 80   Temp 98.3 F (36.8 C) (Oral)   Ht 5' 1.2 (1.554 m)   Wt 238 lb 9.6 oz (108.2 kg)   LMP  (LMP Unknown)   SpO2 98%   BMI 44.79 kg/m  BP Readings from Last 3 Encounters:  08/02/24 134/72  07/03/24 137/77  02/13/24 127/81   Wt Readings from Last 3 Encounters:  08/02/24 238 lb 9.6 oz (108.2 kg)  07/28/23 236 lb 12.8 oz (107.4 kg)  06/30/23 238 lb 6.4 oz (108.1 kg)      Physical Exam Constitutional:      Appearance: Normal appearance. She is obese.  HENT:     Head: Normocephalic and atraumatic.     Right  Ear: Tympanic membrane, ear canal and external ear normal.     Left Ear: Tympanic membrane, ear canal and external ear normal.     Nose: Nose normal.     Mouth/Throat:     Mouth: Mucous membranes are moist.     Pharynx: No oropharyngeal exudate or posterior oropharyngeal erythema.  Eyes:     General: No scleral icterus.    Extraocular Movements: Extraocular movements intact.     Conjunctiva/sclera: Conjunctivae normal.     Pupils: Pupils are equal, round, and reactive to light.  Neck:     Thyroid : No thyromegaly.     Vascular: No carotid bruit.  Cardiovascular:     Rate and Rhythm: Normal rate and regular rhythm.     Pulses: Normal pulses.     Heart sounds: Normal heart sounds. No murmur heard.    No friction rub.  Pulmonary:     Effort: Pulmonary effort is normal.     Breath sounds: Normal breath sounds. No wheezing, rhonchi or rales.  Abdominal:     General: Bowel sounds are normal.     Palpations: Abdomen is soft.     Tenderness: There is no abdominal tenderness.  Musculoskeletal:  General: No deformity. Normal range of motion.  Lymphadenopathy:     Cervical: No cervical adenopathy.  Skin:    General: Skin is warm and dry.     Findings: No lesion.  Neurological:     General: No focal deficit present.     Mental Status: She is alert and oriented to person, place, and time.  Psychiatric:        Mood and Affect: Mood normal.        Thought Content: Thought content normal.        07/28/2023    4:48 PM 06/30/2023    9:09 AM 02/10/2021    4:58 PM  Depression screen PHQ 2/9  Decreased Interest 0 0 0  Down, Depressed, Hopeless 0 0 0  PHQ - 2 Score 0 0 0  Altered sleeping 0 0   Tired, decreased energy 0 1   Change in appetite 0 0   Feeling bad or failure about yourself  0 0   Trouble concentrating 0 0   Moving slowly or fidgety/restless 0 0   Suicidal thoughts 0 0   PHQ-9 Score 0  1    Difficult doing work/chores Not difficult at all Not difficult at all       Data saved with a previous flowsheet row definition      07/28/2023    4:48 PM 06/30/2023    9:09 AM  GAD 7 : Generalized Anxiety Score  Nervous, Anxious, on Edge 0 0  Control/stop worrying 0 0  Worry too much - different things 0 0  Trouble relaxing 0 0  Restless 0 0  Easily annoyed or irritable 0 0  Afraid - awful might happen 0 0  Total GAD 7 Score 0 0  Anxiety Difficulty Not difficult at all      No results found for any visits on 08/02/24.    Assessment & Plan:   Well adult exam -     CBC with Differential/Platelet; Future -     Comprehensive metabolic panel with GFR; Future -     Hemoglobin A1c; Future -     Lipid panel; Future -     T4, free; Future -     TSH; Future  Class 3 severe obesity with serious comorbidity and body mass index (BMI) of 40.0 to 44.9 in adult, unspecified obesity type (HCC)  Pure hypertriglyceridemia -     Lipid panel; Future  Age-appropriate health screenings discussed.  Obtain labs.  Immunizations reviewed and up-to-date.  Consider flu and pneumonia vaccines.  Mammogram done 07/31/2024.  Pap done 08/02/2023.  Colonoscopy done 11/19/21 with 10-year recall continue 2033.Body mass index is 44.79 kg/m.  Lifestyle modification strongly encouraged.  Read labels to help with triglycerides.   Return in about 1 year (around 08/02/2025) for physical.   [name] Single, MD    [1]  Social History Tobacco Use   Smoking status: Never   Smokeless tobacco: Never  Vaping Use   Vaping status: Never Used  Substance Use Topics   Alcohol use: No   Drug use: No  [2] No Known Allergies  "

## 2024-08-17 ENCOUNTER — Ambulatory Visit: Payer: Self-pay | Admitting: Family Medicine

## 2024-08-17 DIAGNOSIS — R778 Other specified abnormalities of plasma proteins: Secondary | ICD-10-CM

## 2024-09-06 ENCOUNTER — Other Ambulatory Visit
